# Patient Record
Sex: Female | Born: 1945 | Race: White | Hispanic: No | Smoking: Never smoker
Health system: Southern US, Community
[De-identification: ages and names within clinical notes are randomized; demographics above are authoritative.]

## PROBLEM LIST (undated history)

## (undated) ENCOUNTER — Emergency Department (HOSPITAL_COMMUNITY): Discharge status: Absent without leave | Payer: Self-pay

## (undated) DIAGNOSIS — E079 Disorder of thyroid, unspecified: Secondary | ICD-10-CM

## (undated) HISTORY — DX: Disorder of thyroid, unspecified: E07.9

## (undated) HISTORY — PX: TONSILLECTOMY: SUR1361

---

## 1999-10-27 ENCOUNTER — Other Ambulatory Visit: Admission: RE | Admit: 1999-10-27 | Discharge: 1999-10-27 | Payer: Self-pay | Admitting: Obstetrics and Gynecology

## 2000-11-15 ENCOUNTER — Other Ambulatory Visit: Admission: RE | Admit: 2000-11-15 | Discharge: 2000-11-15 | Payer: Self-pay | Admitting: Obstetrics and Gynecology

## 2001-02-03 ENCOUNTER — Other Ambulatory Visit: Admission: RE | Admit: 2001-02-03 | Discharge: 2001-02-03 | Payer: Self-pay | Admitting: Obstetrics and Gynecology

## 2001-02-23 ENCOUNTER — Encounter (INDEPENDENT_AMBULATORY_CARE_PROVIDER_SITE_OTHER): Payer: Self-pay | Admitting: Specialist

## 2001-02-23 ENCOUNTER — Other Ambulatory Visit: Admission: RE | Admit: 2001-02-23 | Discharge: 2001-02-23 | Payer: Self-pay | Admitting: Obstetrics and Gynecology

## 2001-10-19 ENCOUNTER — Other Ambulatory Visit: Admission: RE | Admit: 2001-10-19 | Discharge: 2001-10-19 | Payer: Self-pay | Admitting: *Deleted

## 2002-03-10 ENCOUNTER — Other Ambulatory Visit: Admission: RE | Admit: 2002-03-10 | Discharge: 2002-03-10 | Payer: Self-pay | Admitting: *Deleted

## 2002-06-30 ENCOUNTER — Other Ambulatory Visit: Admission: RE | Admit: 2002-06-30 | Discharge: 2002-06-30 | Payer: Self-pay | Admitting: Obstetrics and Gynecology

## 2002-10-04 ENCOUNTER — Other Ambulatory Visit: Admission: RE | Admit: 2002-10-04 | Discharge: 2002-10-04 | Payer: Self-pay | Admitting: Obstetrics and Gynecology

## 2002-10-09 ENCOUNTER — Encounter: Payer: Self-pay | Admitting: Obstetrics and Gynecology

## 2002-10-09 ENCOUNTER — Ambulatory Visit (HOSPITAL_COMMUNITY): Admission: RE | Admit: 2002-10-09 | Discharge: 2002-10-09 | Payer: Self-pay | Admitting: Obstetrics and Gynecology

## 2003-01-17 ENCOUNTER — Other Ambulatory Visit: Admission: RE | Admit: 2003-01-17 | Discharge: 2003-01-17 | Payer: Self-pay | Admitting: Obstetrics and Gynecology

## 2004-01-24 ENCOUNTER — Other Ambulatory Visit: Admission: RE | Admit: 2004-01-24 | Discharge: 2004-01-24 | Payer: Self-pay | Admitting: Obstetrics and Gynecology

## 2007-05-16 ENCOUNTER — Ambulatory Visit: Payer: Self-pay | Admitting: Cardiology

## 2007-06-02 ENCOUNTER — Encounter: Payer: Self-pay | Admitting: Cardiology

## 2007-06-02 ENCOUNTER — Ambulatory Visit: Payer: Self-pay

## 2007-06-24 ENCOUNTER — Ambulatory Visit: Payer: Self-pay | Admitting: Cardiology

## 2007-06-24 LAB — CONVERTED CEMR LAB: CRP, High Sensitivity: <1 — ABNORMAL LOW (ref 0.00–5.00)

## 2008-05-29 ENCOUNTER — Ambulatory Visit: Payer: Self-pay

## 2008-05-29 ENCOUNTER — Ambulatory Visit: Payer: Self-pay | Admitting: Cardiology

## 2008-12-19 DIAGNOSIS — I359 Nonrheumatic aortic valve disorder, unspecified: Secondary | ICD-10-CM | POA: Insufficient documentation

## 2008-12-19 DIAGNOSIS — I6529 Occlusion and stenosis of unspecified carotid artery: Secondary | ICD-10-CM | POA: Insufficient documentation

## 2008-12-19 DIAGNOSIS — R609 Edema, unspecified: Secondary | ICD-10-CM | POA: Insufficient documentation

## 2008-12-19 DIAGNOSIS — E785 Hyperlipidemia, unspecified: Secondary | ICD-10-CM

## 2010-12-24 ENCOUNTER — Ambulatory Visit
Admission: RE | Admit: 2010-12-24 | Discharge: 2010-12-24 | Payer: Self-pay | Source: Home / Self Care | Attending: Vascular Surgery | Admitting: Vascular Surgery

## 2010-12-25 NOTE — Consult Note (Signed)
NEW PATIENT CONSULTATION  Janice Hodge, TARQUINIO DOB:  [birth year]                                       12/24/2010 [id number]#:[id number]  This patient presents today for discussion regarding venous pathology in both lower extremities.  She is an otherwise healthy 65-year white female who presents due to varicosities in her medial right calf and pronounced telangiectasia over her pretibial area on the left.  She reports that she had struck this  area of the left pretibial area many years ago while scuba diving and developed telangiectasia following this.  She does report some discomfort over this with prolonged standing.  She also has  varicosities over the medial aspect of her right calf with some discomfort with prolonged standing of this as well. She does not have any history of DVT and  no significant swelling.  PAST MEDICAL HISTORY:  Significant for elevated cholesterol.  No prior surgical history.  SOCIAL HISTORY:  She is married.  She does not smoke or drink alcohol.  FAMILY HISTORY:  Positive for cardiomegaly in her father,  myocardial infarction in sister age 67.  Two brothers had heart attacks  by 60 years.  REVIEW OF SYSTEMS:  No weight loss or gain.  She weighs 140 pounds.  She is 5 foot 8 inches tall. VASCULAR:  Positive for venous pathology. PULMONARY:  Productive cough. Otherwise  review of systems is negative.  PHYSICAL EXAMINATION:  General:  Well developed, well nourished white female appearing stated age 70 in no acute stress.  Blood pressure 131/80, pulse 73, respirations 18.  HEENT:  Normal.  Her radial and dorsalis pedis pulses are 2+ bilaterally.  Musculoskeletal:  Shows no major deformities or cyanosis.  Neurologic:  No focal weakness or paresthesias.  Skin:  Without ulcers or rashes.  She does have a very pronounced large telangiectasia over her left pretibial area which is spanning out from a central reticular vein.  She has a similar  pattern to a much markedly less  degree on the right pretibial area.  She also has scattered telangiectasia over both thighs.  On the medial aspect of her right calf, she does have varicosities extending just below the knee and extending down to the mid calf.  She has not had any similar varicosities on the left leg.  She underwent a screening ultrasound by myself with SonoSite showing reflux in a dilated right great saphenous vein extending into these varicosities.  She does not have any similar enlargement in her left great saphenous vein.  I discussed this at length with this patient explaining I do not see any evidence of serious venous pathology.  I explained treatment options for sclerotherapy for the telangiectasia and laser ablation of her right great saphenous vein and stab phlebectomy of tributary varicosities in her right leg.  I explained that indication for this would severe pain.  She reports she is able to tolerate this current level of discomfort and requests continued observation only.  I feel this is appropriate.  She also understands potential benefit from sclerotherapy for cosmetic concerns in her left leg.  She will notify us should she wish to proceed with procedure.    Larina Earthly, M.D. Electronically Signed  TFE/MEDQ  D:  12/24/2010  T:  12/25/2010  Job:  [id number]

## 2011-03-31 ENCOUNTER — Encounter: Payer: Self-pay | Admitting: Family Medicine

## 2011-03-31 ENCOUNTER — Ambulatory Visit (INDEPENDENT_AMBULATORY_CARE_PROVIDER_SITE_OTHER): Payer: Federal, State, Local not specified - PPO | Admitting: Family Medicine

## 2011-03-31 VITALS — BP 93/66 | Ht 66.0 in | Wt 135.0 lb

## 2011-03-31 DIAGNOSIS — M722 Plantar fascial fibromatosis: Secondary | ICD-10-CM

## 2011-03-31 NOTE — Progress Notes (Signed)
  Subjective:    Patient ID: Janice Hodge, female    DOB: [birth year], 65 y.o.   MRN: [id number]  HPI 1-2 months ago started having right heel pain.  No new shoes, physical activity, or injury.   Has tried ice bottle massage, no improvement.  Walks daily for exercise. No history of prior injury or fracture.  The patient presents with a 1-2 long history of heel pain. This is notable for worsening pain first thing in the morning when arising and standing after sitting.   Prior foot or ankle fractures: none Prior operations: none Orthotics or bracing: none Medications: none PT or home rehab: occ stretching Night splints: no Ice massage: yes Ball massage: no  Metatarsal pain: no  REVIEW OF SYSTEMS  GEN: No fevers, chills. Nontoxic. Primarily MSK c/o today. MSK: Detailed in the HPI GI: tolerating PO intake without difficulty Neuro: No numbness, parasthesias, or tingling associated. Otherwise the pertinent positives of the ROS are noted above.    Review of Systems   Objective:   Physical Exam  GEN: Well-developed,well-nourished,in no acute distress; alert,appropriate and cooperative throughout examination HEENT: Normocephalic and atraumatic without obvious abnormalities. Ears, externally no deformities PULM: Breathing comfortably in no respiratory distress EXT: No clubbing, cyanosis, or edema PSYCH: Normally interactive. Cooperative during the interview. Pleasant. Friendly and conversant. Not anxious or depressed appearing. Normal, full affect.  RIGHT Echymosis: no Edema: no ROM: full LE B Gait: heel toe, non-antalgic MT pain: no Callus pattern: none Lateral Mall: NT Medial Mall: NT Talus: NT Navicular: NT Calcaneous: NT Metatarsals: NT 5th MT: NT Phalanges: NT Achilles: NT Plantar Fascia: tender, medial along PF. Pain with forced dorsi Fat Pad: NT Peroneals: NT Post Tib: NT Great Toe: Nml motion Ant Drawer: neg Other foot breakdown: none Long arch:  preserved Transverse arch: preserved Hindfoot breakdown: none Sensation: intact          Assessment & Plan:

## 2011-03-31 NOTE — Patient Instructions (Signed)
Please read handouts on Plantar Fascitis.  STRETCHING and Strengthening program critically important.  Strengthening on foot and calf muscles as seen in handout. Calf raises, 2 legged, then 1 legged. Foot massage with tennis ball. Ice massage.  NEEDS TO BE DONE EVERY DAY  Recommended over the counter insoles. (Spenco or Hapad)  A rigid shoe with good arch support helps: Dansko (great), Keen, Merrell No easily bendable shoes.   

## 2011-03-31 NOTE — Assessment & Plan Note (Addendum)
Recommended shoes with a heel, to use heel cup, rehabilitation exercise- see patient instructions  We reviewed that stretching is critically important to the treatment of PF. Reviewed footwear. Rigid soles have been shown to help with PF. Reviewed rehab of stretching and calf raises.  The patient would be an ideal candidate for custom orthotics, which were shown in a 2008 Cochrane Review to be beneficial in PF. Could benefit from a corticosteroid injection if conservative treatment fails.

## 2011-04-14 NOTE — Assessment & Plan Note (Signed)
[facility]                            CARDIOLOGY OFFICE NOTE   Janice, Hodge                       MRN:          [id number]  DATE:05/18/2007                            DOB:          1946-10-08    REFERRING PHYSICIAN:  Carrington Clamp, M.D.   REASON FOR EVALUATION:  Cardiac assessment with family history of  coronary artery disease, lower extremity edema and hyperlipidemia.   HISTORY OF PRESENT ILLNESS:  Janice Hodge is a 65 year old woman with an  apparent history of hypothyroidism and treated with Armour Thyroid over  the last several months.  She had a TSH of 27 back in April, although  with low normal T4 and T3.  At that time, she also had an LDL  cholesterol of 156 and an ACL cholesterol of 89.  She has no personal  history of hypertension, type 2 diabetes mellitus or cardiovascular  disease.  She does report a history of mitral valve prolapse .  Symptomatically, she denies having any problems with chest pain or  dyspnea on exertion.  She reports some edema in her ankles and in  retrospect a long-standing history of a asymmetry of her legs around the  calves, right greater than left, since age 65.  She also tells me that  she had a general health screening approximately 6-8 years ago and was  told that she had some degree of carotid artery plaque.  She headache  shad no follow up for this since that time.  She has been more concerned  generally with her health and these issues and is referred here today to  discuss additional cardiac risk stratification testing.  Her resting  electrocardiogram shows borderline left ventricular hypertrophy with  possible left atrial enlargement.  Sinus rhythm is noted at 62 beats per  minute.  Today, I reviewed her risk factor profile and also discussed  her testing.  Her family history is significant for cardiovascular  disease, predominantly in her siblings, beginning in the fifth to sixth  decade.   ALLERGIES:  No known drug allergies.   PREMEDICATION:  1. Armour thyroid 30 mg two tablets p.o. daily.  2. Contegra daily.  3. DHEA 5 mg p.o. daily.  4. Algie Coffer one p.o. p.r.n.  5. NMP 50 mg p.o. daily.  6. Omega-3 supplements.  7. Osteo sheath b.i.d.  8. Other multivitamin preparations.   PAST MEDICAL HISTORY:  As outlined above.  The patient reports removal  of an ovarian cyst in 1994.  Denies any other major surgeries.   REVIEW OF SYSTEMS:  As described in history of present illness.  She  complains of general fatigue.  She also does not sleep well.  She has no  palpitations, orthopnea, PND or syncope.   SOCIAL HISTORY:  The patient has two children.  She works as a Corporate treasurer.  She follows her diet carefully.  She denies any tobacco or  alcohol use.  She exercises primarily by walking.   FAMILY HISTORY:  AS outlined above.  The patient reports that her father  died at age 71.  He had  an enlarged heart.  She has a sister who died  at age 30 with uterine cancer and also heart disease.  Other siblings  with heart disease in their 56s and 9s.   PHYSICAL EXAMINATION:  VITAL SIGNS:  Blood pressure 103/66, heart rate  62, weight 145 pounds.  GENERAL:  This is a normally nourished appearing woman in no acute  distress.  HEENT:  Conjunctivae is normal.  Pharynx is clear.  NECK:  Supple.  No elevated jugular venous pressure, and without bruits.  No thyromegaly or thyroid tenderness is noted.  LUNGS:  Clear without air breathing.  CARDIAC:  Regular rate and rhythm with an early systolic click.  No loud  murmur.  No S3 gallops.  ABDOMEN:  Soft, normoactive bowel sounds . No bruits.  EXTREMITIES:  No significant pitting edema.  There is some mild  asymmetry to the calves, right greater than left.  Although, not  markedly so.  She does have evidence of some venous varicosities and  spider veins.  Distal pulses are 2+.  MUSCULOSKELETAL:  No kyphosis is noted.   NEUROPSYCHIATRIC:  Patient is alert and oriented x3.  Affect is normal.   IMPRESSION/RECOMMENDATIONS:  General cardiac screening in a 65 year old  woman with hyperlipidemia as outlined.  Family history of premature  cardiovascular disease, possible hypothyroidism and apparent mitral  valve prolapse based on early systolic click and reported history.  She  also reports a prior health screening with a report of carotid  atherosclerosis.  She has not had any frank bruits on exam.  She relates  some lower extremity edema around the ankles and general asymmetry to  her calves, although, this seems to be a chronic problem.   PLAN:  1. Our plan is a screening exercise echocardiogram with rest images to      assess the mitral valve as well.  We will also follow up with a      carotid Duplex scan and lower extremity venous studies.  I will      then have her follow up over the next few weeks to discuss the      results.  2. Further plans to follow.     Jonelle Sidle, MD  Electronically Signed    SGM/MedQ  DD: 05/16/2007  DT: 05/17/2007  Job #: [id number]   cc:   Carrington Clamp, M.D.

## 2011-04-14 NOTE — Assessment & Plan Note (Signed)
[facility]                            CARDIOLOGY OFFICE NOTE   Janice Hodge, Janice Hodge                       MRN:          [id number]  DATE:05/29/2008                            DOB:          1946/06/01    PRIMARY CARE PHYSICIAN:  Carrington Clamp, MD.   REASON FOR VISIT:  Cardiac followup.   HISTORY OF PRESENT ILLNESS:  I saw Janice Hodge back in June 2008.  She is  referred for general cardiac assessment as outlined in my previous note.  We referred her for screening studies including an exercise  echocardiogram, which was normal and a resting echocardiogram  demonstrating mild aortic valvular regurgitation and left ventricular  ejection fraction of 55-65%.  She had lower extremity venous studies  given concerns about asymmetric edema in her right leg and this revealed  no evidence of deep or superficial venous thromboses.  She had carotid  studies demonstrating mild bilateral internal carotid artery  atherosclerotic plaque.  She had a followup study earlier today  revealing similar findings.  Symptomatically, Janice Hodge denies any  problems with angina or breathlessness.  Today, we talked about diet and  exercise strategies.  At this point, I do not anticipate any additional  cardiac evaluation assuming she continues with regular followup and  primary care.   ALLERGIES:  No known drug allergies.   MEDICATIONS:  Include Armour Thyroid and herbal supplements.   REVIEW OF SYSTEMS:  As described in the history of present illness.  She  continues to have a sensation and her right calf is larger than left and  this has been a chronic problem for multiple years.  There has been no  change.  No palpitations or syncope.  Otherwise, negative.   PHYSICAL EXAMINATION:  VITAL SIGNS:  Blood pressure is 98/68, heart rate  is 88, and weight is 148 pounds.  GENERAL:  The patient is normally nourished appearing, in no acute  distress.  NECK:  No elevated jugular  venous pressure.  LUNGS:  Clear without labored breathing.  CARDIAC:  Regular rate and rhythm.  No pathologic murmur noted today.  EXTREMITIES:  No frank pitting edema.   IMPRESSION AND RECOMMENDATIONS:  Documentation of mild internal carotid  artery plaquing, asymptomatic.  My suggestion at this point is basic  risk factor modification mainly blood pressure and lipid management.  She has had ischemic testing, which was very reassuring and has no major  valvular heart disease by echocardiography.  She also had no major  findings of venous thrombotic disease on prior ultrasound examination of  the lower  extremities.  I anticipate that Janice Hodge will continue to follow with  Dr. Henderson Cloud and her cardiology followup can be as needed.     Jonelle Sidle, MD  Electronically Signed    SGM/MedQ  DD: 05/29/2008  DT: 05/30/2008  Job #: [id number]   cc:   Carrington Clamp, M.D.

## 2011-04-17 NOTE — Assessment & Plan Note (Signed)
[facility]                            CARDIOLOGY OFFICE NOTE   Janice Hodge, Janice Hodge                       MRN:          [id number]  DATE:06/24/2007                            DOB:          08-05-1946    PRIMARY CARE PHYSICIAN:  Talmadge Coventry, M.D.   REASON FOR VISIT:  Followup cardiac testing.   HISTORY OF PRESENT ILLNESS:  I saw Janice Hodge back in June for general  CV evaluation with a family history of premature cardiovascular disease,  hypothyroidism, possible mitral valve prolapse, and also hyperlipidemia.  She had complained of some general asymmetry of her lower extremities  and some possible venous insufficiency, as well as varicosities.  She  had also undergone a previous health screening demonstrating some  element of carotid artery disease.  We performed a followup carotid  duplex scan, which revealed a 0-39% bilateral internal carotid artery  stenoses.  Lower extremity venous studies demonstrated no deep or  superficial venous thrombus.  There was mild reflux in the left  posterior tibial and popliteal veins with 1 to 2 seconds of flow  reversal, and I suspect that she does have some venous insufficiency.  Today, we talked about compression hose.  Her resting echocardiogram  demonstrated a normal left ventricular ejection fraction of 55-60%.  The  mitral valve was described as normal, and there was only trivial mitral  regurgitation.  Mild aortic regurgitation was also noted.  Stress  echocardiography demonstrated no active ischemia, either by  electrocardiographic, or echocardiographic findings.  Today, I reviewed  these studies with the patient and her husband.  We also went over her  prior lipid profile, which showed a total cholesterol of 260,  triglyceride 75, HDL 89, and LDL 156.  Talked about some risk factor  modification strategies today.   ALLERGIES:  No known drug allergies.   MEDICATIONS:  AmourThyroid 30 mg 2 tablets  p.o. daily.   REVIEW OF SYSTEMS:  As described in the history of present illness.   EXAMINATION:  Blood pressure is 102/69, heart rate is 66.  No other marked changes in examination as documented in the previous  note.   IMPRESSION/RECOMMENDATIONS:  1. Dyslipidemia as outlined above.  Janice Hodge has some family history      of premature cardiovascular disease and evidence of mild      atherosclerosis based on her carotid artery testing.  She is      asymptomatic at this time.  We discussed a baby aspirin daily,      particularly when she nears 65 years of age for primary prevention.      I also think it would be reasonable to send a high sensitivity C-      reactive protein to help [place] determine how aggressive we should be      with her LDL cholesterol.  She has a relatively effective effect      with her high HDL at this time.  Otherwise, my plan is to have her      come back in 1 year's time for a repeat carotid ultrasound.  We did      talk about compression hose given her venous varicosities and some      element of venous insufficiency.  Her other cardiac testing was      reassuring.  We will plan to inform her of her high sensitivity C-      reactive protein results and to proceed on from      there.  2. Further plan is to follow.     Jonelle Sidle, MD  Electronically Signed    SGM/MedQ  DD: 06/24/2007  DT: 06/24/2007  Job #: [id number]   cc:   Talmadge Coventry, M.D.  Carrington Clamp, M.D.

## 2011-07-16 ENCOUNTER — Ambulatory Visit
Admission: RE | Admit: 2011-07-16 | Discharge: 2011-07-16 | Disposition: A | Discharge status: Home / Self Care | Payer: Federal, State, Local not specified - PPO | Source: Ambulatory Visit | Attending: Internal Medicine | Admitting: Internal Medicine

## 2011-07-16 ENCOUNTER — Other Ambulatory Visit: Payer: Self-pay | Admitting: Internal Medicine

## 2011-07-16 DIAGNOSIS — J189 Pneumonia, unspecified organism: Secondary | ICD-10-CM

## 2011-07-16 MED ORDER — IOHEXOL 300 MG/ML  SOLN
75.0000 mL | Freq: Once | INTRAMUSCULAR | Status: AC | PRN
  Administered 2011-07-16: 75 mL via INTRAVENOUS

## 2012-03-16 ENCOUNTER — Encounter: Payer: Self-pay | Admitting: Internal Medicine

## 2012-04-01 ENCOUNTER — Ambulatory Visit (AMBULATORY_SURGERY_CENTER): Payer: Federal, State, Local not specified - PPO | Admitting: *Deleted

## 2012-04-01 VITALS — Ht 67.0 in | Wt 155.0 lb

## 2012-04-01 DIAGNOSIS — Z1211 Encounter for screening for malignant neoplasm of colon: Secondary | ICD-10-CM

## 2012-04-01 MED ORDER — PEG-KCL-NACL-NASULF-NA ASC-C 100 G PO SOLR: ORAL | Status: DC

## 2012-04-04 ENCOUNTER — Encounter: Payer: Self-pay | Admitting: Internal Medicine

## 2012-05-06 ENCOUNTER — Ambulatory Visit (AMBULATORY_SURGERY_CENTER): Payer: Federal, State, Local not specified - PPO | Admitting: Internal Medicine

## 2012-05-06 ENCOUNTER — Encounter: Payer: Self-pay | Admitting: Internal Medicine

## 2012-05-06 VITALS — BP 101/68 | HR 51 | Temp 97.7°F | Resp 16 | Ht 67.0 in | Wt 155.0 lb

## 2012-05-06 DIAGNOSIS — Z1211 Encounter for screening for malignant neoplasm of colon: Secondary | ICD-10-CM

## 2012-05-06 MED ORDER — SODIUM CHLORIDE 0.9 % IV SOLN: 500.0000 mL | INTRAVENOUS | Status: DC

## 2012-05-06 NOTE — Op Note (Signed)
[facility] [address] [place], [place]  [address]  COLONOSCOPY PROCEDURE REPORT  PATIENT:  Janice Hodge, Janice Hodge  MR#:  [id number] BIRTHDATE:  1946-01-31, 66 yrs. old  GENDER:  female ENDOSCOPIST:  Hedwig Morton. Juanda Chance, MD REF. BY:  Rodrigo Ran, M.D. PROCEDURE DATE:  05/06/2012 PROCEDURE:  Colonoscopy 65784 ASA CLASS:  Class I INDICATIONS:  colorectal cancer screening, average risk MEDICATIONS:   MAC sedation, administered by CRNA, propofol (Diprivan) 120 mg  DESCRIPTION OF PROCEDURE:   After the risks and benefits and of the procedure were explained, informed consent was obtained. Digital rectal exam was performed and revealed no rectal masses. The LB CF-[id number] endoscope was introduced through the anus and advanced to the cecum, which was identified by both the appendix and ileocecal valve.  The quality of the prep was excellent, using MoviPrep.  The instrument was then slowly withdrawn as the colon was fully examined. <<PROCEDUREIMAGES>>  FINDINGS:  Scattered diverticula were found in the sigmoid colon (see image4). several shallow diverticuli, sharp turn at the pelvic rim  This was otherwise a normal examination of the colon (see image5, image3, image2, and image1).   Retroflexed views in the rectum revealed no abnormalities.    The scope was then withdrawn from the patient and the procedure completed.  COMPLICATIONS:  None ENDOSCOPIC IMPRESSION: 1) Diverticula, scattered in the sigmoid colon 2) Otherwise normal examination RECOMMENDATIONS: 1) High fiber diet.  REPEAT EXAM:  In 10 year(s) for.  ______________________________ Hedwig Morton. Juanda Chance, MD  CC:  n. eSIGNED:   Hedwig Morton. Meleana Commerford at 05/06/2012 08:32 AM  Fran Lowes, [phone]

## 2012-05-06 NOTE — Patient Instructions (Signed)
YOU HAD AN ENDOSCOPIC PROCEDURE TODAY AT THE [facility]: Refer to the procedure report that was given to you for any specific questions about what was found during the examination.  If the procedure report does not answer your questions, please call your gastroenterologist to clarify.  If you requested that your care partner not be given the details of your procedure findings, then the procedure report has been included in a sealed envelope for you to review at your convenience later.  YOU SHOULD EXPECT: Some feelings of bloating in the abdomen. Passage of more gas than usual.  Walking can help get rid of the air that was put into your GI tract during the procedure and reduce the bloating. If you had a lower endoscopy (such as a colonoscopy or flexible sigmoidoscopy) you may notice spotting of blood in your stool or on the toilet paper. If you underwent a bowel prep for your procedure, then you may not have a normal bowel movement for a few days.  DIET: Your first meal following the procedure should be a light meal and then it is ok to progress to your normal diet.  A half-sandwich or bowl of soup is an example of a good first meal.  Heavy or fried foods are harder to digest and may make you feel nauseous or bloated.  Likewise meals heavy in dairy and vegetables can cause extra gas to form and this can also increase the bloating.  Drink plenty of fluids but you should avoid alcoholic beverages for 24 hours.  ACTIVITY: Your care partner should take you home directly after the procedure.  You should plan to take it easy, moving slowly for the rest of the day.  You can resume normal activity the day after the procedure however you should NOT DRIVE or use heavy machinery for 24 hours (because of the sedation medicines used during the test).    SYMPTOMS TO REPORT IMMEDIATELY: A gastroenterologist can be reached at any hour.  During normal business hours, 8:30 AM to 5:00 PM Monday through Friday,  call [phone].  After hours and on weekends, please call the GI answering service at [phone] who will take a message and have the physician on call contact you.   Following lower endoscopy (colonoscopy or flexible sigmoidoscopy):  Excessive amounts of blood in the stool  Significant tenderness or worsening of abdominal pains  Swelling of the abdomen that is new, acute  Fever of 100F or higher  Black, tarry-looking stools  FOLLOW UP: If any biopsies were taken you will be contacted by phone or by letter within the next 1-3 weeks.  Call your gastroenterologist if you have not heard about the biopsies in 3 weeks.  Our staff will call the home number listed on your records the next business day following your procedure to check on you and address any questions or concerns that you may have at that time regarding the information given to you following your procedure. This is a courtesy call and so if there is no answer at the home number and we have not heard from you through the emergency physician on call, we will assume that you have returned to your regular daily activities without incident.  SIGNATURES/CONFIDENTIALITY: You and/or your care partner have signed paperwork which will be entered into your electronic medical record.  These signatures attest to the fact that that the information above on your After Visit Summary has been reviewed and is understood.  Full responsibility of   the confidentiality of this discharge information lies with you and/or your care-partner.  

## 2012-05-06 NOTE — Progress Notes (Signed)
Patient did not experience any of the following events: a burn prior to discharge; a fall within the facility; wrong site/side/patient/procedure/implant event; or a hospital transfer or hospital admission upon discharge from the facility. (G8907) Patient did not have preoperative order for IV antibiotic SSI prophylaxis. (G8918)  

## 2012-05-09 ENCOUNTER — Telehealth: Payer: Self-pay | Admitting: *Deleted

## 2012-05-09 NOTE — Telephone Encounter (Signed)
  Follow up Call-  Call back number 05/06/2012  Post procedure Call Back phone  # [phone]  Permission to leave phone message Yes     Tried to call x2 and both times line was busy.

## 2013-04-27 ENCOUNTER — Other Ambulatory Visit: Payer: Self-pay | Admitting: Obstetrics and Gynecology

## 2013-04-27 DIAGNOSIS — Z1231 Encounter for screening mammogram for malignant neoplasm of breast: Secondary | ICD-10-CM

## 2013-05-10 ENCOUNTER — Other Ambulatory Visit: Payer: Self-pay | Admitting: Obstetrics and Gynecology

## 2013-05-10 ENCOUNTER — Ambulatory Visit
Admission: RE | Admit: 2013-05-10 | Discharge: 2013-05-10 | Disposition: A | Discharge status: Home / Self Care | Payer: Federal, State, Local not specified - PPO | Source: Ambulatory Visit | Attending: Obstetrics and Gynecology | Admitting: Obstetrics and Gynecology

## 2013-05-10 DIAGNOSIS — Z1231 Encounter for screening mammogram for malignant neoplasm of breast: Secondary | ICD-10-CM

## 2013-05-24 ENCOUNTER — Ambulatory Visit: Payer: Federal, State, Local not specified - PPO

## 2013-06-13 ENCOUNTER — Ambulatory Visit (INDEPENDENT_AMBULATORY_CARE_PROVIDER_SITE_OTHER): Payer: Federal, State, Local not specified - PPO | Admitting: Endocrinology

## 2013-06-13 ENCOUNTER — Encounter: Payer: Self-pay | Admitting: Endocrinology

## 2013-06-13 VITALS — BP 116/72 | HR 62 | Temp 97.6°F | Resp 14 | Ht 66.0 in | Wt 152.0 lb

## 2013-06-13 DIAGNOSIS — E039 Hypothyroidism, unspecified: Secondary | ICD-10-CM

## 2013-06-13 NOTE — Progress Notes (Signed)
Subjective:    Patient ID: Janice Hodge, female    DOB: 1946-11-05, 67 y.o.   MRN: [id number]  HPI Pt reports approx 50 years of moderate chronic primary hypothyroidism.  She was not rx'ed until the mid-1990's, when she was rx'ed pork thyroid extract.  Old recocrds obtained say that, although results are not available, she had normal TFT off any thyroid medication.  She says she has been on synthroid at her current dosage of 75 mcg/day, for at least 15 years.  However, chart says she was noted by dr Waynard Edwards to have elevated TSH in 2012.  She reports mild hair loss throughout the head, and assoc fatigue.  She has read that her sxs might be attributable to iodine deficiency.  She previously took an OTC iodine supplement, but has been off it x approx 6 weeks.   Past Medical History  Diagnosis Date  . Thyroid disease     Past Surgical History  Procedure Laterality Date  . Tonsillectomy      as child    History   Social History  . Marital Status: Divorced    Spouse Name: N/A    Number of Children: N/A  . Years of Education: N/A   Occupational History  . Not on file.   Social History Main Topics  . Smoking status: Never Smoker   . Smokeless tobacco: Never Used  . Alcohol Use: Yes     Comment: 'seasonal drinking" per pt.  . Drug Use: No  . Sexually Active: Not on file   Other Topics Concern  . Not on file   Social History Narrative  . No narrative on file    Current Outpatient Prescriptions on File Prior to Visit  Medication Sig Dispense Refill  . SYNTHROID 75 MCG tablet Take 1 tablet by mouth Daily. BRAND NAME ONLY      . valACYclovir (VALTREX) 500 MG tablet Take 1 tablet by mouth as needed.       No current facility-administered medications on file prior to visit.    No Known Allergies  Family History  Problem Relation Age of Onset  . Colon cancer Neg Hx   . Heart disease Mother   . Heart disease Father     BP 116/72  Pulse 62  Temp(Src) 97.6 F (36.4 C)  (Oral)  Resp 14  Ht 5\' 6"  (1.676 m)  Wt 152 lb (68.947 kg)  BMI 24.55 kg/m2  SpO2 97%    Review of Systems denies depression, cramps, sob, weight gain, constipation, numbness, blurry vision, myalgias, dry skin, rhinorrhea, easy bruising, and syncope.  She reports intermittent forgetfulness, decreased libido,  and anxiety.      Objective:   Physical Exam VS: see vs page GEN: no distress HEAD: head: no deformity eyes: no periorbital swelling, no proptosis external nose and ears are normal mouth: no lesion seen NECK: thyroid is slightly enlarged, with an irregular surface.  No palpable nodule.   CHEST WALL: no deformity LUNGS:  Clear to auscultation CV: reg rate and rhythm, no murmur ABD: abdomen is soft, nontender.  no hepatosplenomegaly.  not distended.  no hernia.   MUSCULOSKELETAL: muscle bulk and strength are grossly normal.  no obvious joint swelling.  gait is normal and steady EXTEMITIES: no deformity.  no ulcer on the feet.  feet are of normal color and temp.  no edema.   PULSES: dorsalis pedis intact bilat.  no carotid bruit NEURO:  cn 2-12 grossly intact.   readily moves all 4's.  sensation is intact to touch on the feet.   SKIN:  Normal texture and temperature.  No rash or suspicious lesion is visible.   NODES:  None palpable at the neck PSYCH: alert, oriented x3.  Does not appear anxious nor depressed.     Assessment & Plan:  Chronic hypothyroidism, on rx Anxiety, uncertain if thyroid-related.  This would depend on her recent TFT, so i'll obtain results. Fatigue, prob not thyroid-related.

## 2013-06-13 NOTE — Patient Instructions (Addendum)
blood tests are being requested for you today.  We'll contact you with results.  

## 2013-06-15 LAB — IODINE, SERUM/PLASMA: Iodine: 67 mcg/L (ref 52–109)

## 2014-04-11 ENCOUNTER — Emergency Department (INDEPENDENT_AMBULATORY_CARE_PROVIDER_SITE_OTHER)
Admission: EM | Admit: 2014-04-11 | Discharge: 2014-04-11 | Disposition: A | Discharge status: Home / Self Care | Payer: Federal, State, Local not specified - PPO | Source: Home / Self Care | Attending: Emergency Medicine | Admitting: Emergency Medicine

## 2014-04-11 ENCOUNTER — Encounter (HOSPITAL_COMMUNITY): Payer: Self-pay | Admitting: Emergency Medicine

## 2014-04-11 DIAGNOSIS — J309 Allergic rhinitis, unspecified: Secondary | ICD-10-CM

## 2014-04-11 NOTE — ED Provider Notes (Signed)
CSN: [id number]     Arrival date & time 04/11/14  1543 History   First MD Initiated Contact with Patient 04/11/14 1744     Chief Complaint  Patient presents with  . Sore Throat   (Consider location/radiation/quality/duration/timing/severity/associated sxs/prior Treatment) Patient is a 68 y.o. female presenting with pharyngitis. The history is provided by the patient.  Sore Throat This is a new problem. Episode onset: 6 weeks ago. The problem occurs constantly. The problem has not changed since onset.Pertinent negatives include no shortness of breath. Exacerbated by: worst at night and in the morning. Relieved by: prn use of steroid nasal spray has helped some. Treatments tried: steroid nasal spray. The treatment provided mild relief.    Past Medical History  Diagnosis Date  . Thyroid disease    Past Surgical History  Procedure Laterality Date  . Tonsillectomy      as child   Family History  Problem Relation Age of Onset  . Colon cancer Neg Hx   . Heart disease Mother   . Heart disease Father    History  Substance Use Topics  . Smoking status: Never Smoker   . Smokeless tobacco: Never Used  . Alcohol Use: Yes     Comment: 'seasonal drinking" per pt.   OB History   Grav Para Term Preterm Abortions TAB SAB Ect Mult Living                 Review of Systems  Constitutional: Negative for fever and chills.  HENT: Positive for postnasal drip, rhinorrhea and sore throat. Negative for congestion, ear pain and sinus pressure.   Respiratory: Negative for cough and shortness of breath.     Allergies  Review of patient's allergies indicates no known allergies.  Home Medications   Prior to Admission medications   Medication Sig Start Date End Date Taking? Authorizing Provider  SYNTHROID 75 MCG tablet Take 1 tablet by mouth Daily. BRAND NAME ONLY 03/18/12  Yes Historical Provider, MD  b complex vitamins tablet Take 1 tablet by mouth daily.    Historical Provider, MD   CALCIUM-VITAMIN D PO Take by mouth daily.    Historical Provider, MD  Cholecalciferol (VITAMIN D-3 PO) Take by mouth daily.    Historical Provider, MD  Multiple Vitamin (MULTIVITAMIN) tablet Take 1 tablet by mouth daily.    Historical Provider, MD  valACYclovir (VALTREX) 500 MG tablet Take 1 tablet by mouth as needed. 03/31/12   Historical Provider, MD   BP 119/73  Pulse 57  Temp(Src) 98.6 F (37 C) (Oral)  Resp 17  SpO2 97% Physical Exam  Constitutional: She appears well-developed and well-nourished. No distress.  HENT:  Right Ear: Tympanic membrane, external ear and ear canal normal.  Left Ear: Tympanic membrane, external ear and ear canal normal.  Nose: Rhinorrhea present.  Mouth/Throat: Uvula is midline, oropharynx is clear and moist and mucous membranes are normal.  Boggy nasal mucosa  Cardiovascular: Normal rate and regular rhythm.   Pulmonary/Chest: Effort normal and breath sounds normal.    ED Course  Procedures (including critical care time) Labs Review Labs Reviewed - No data to display  Imaging Review No results found.   MDM   1. Allergic rhinitis   sx most c/w allergic rhinitis. Pt to use otc 2nd gen antihistamine for a month, use steroid nasal spray (nasocort or nasonex) as prescribed daily for next month. F/u with pcp if this doesn't relieve sx.      Cathlyn ParsonsAngela M Valeska Haislip, NP 04/11/14 [phone]

## 2014-04-11 NOTE — ED Notes (Signed)
Pt reports sore throat x1 month while at work [employer]elieves she was exposed to chemical at work??? Sx include "tight throat," fatigue Denies f/v/n/d, HA, dyspnea Also c/o pain right axilla pain onset yest Alert w/no signs of acute distress.

## 2014-04-11 NOTE — Discharge Instructions (Signed)
Use your nasal spray EVERY day as prescribed for the next month. Use Claritin or zyrtec or Allegra (or generic brand) every day for a month.  If these treatments don't resolve your problem, follow up with your primary care doctor.    Allergic Rhinitis Allergic rhinitis is when the mucous membranes in the nose respond to allergens. Allergens are particles in the air that cause your body to have an allergic reaction. This causes you to release allergic antibodies. Through a chain of events, these eventually cause you to release histamine into the blood stream. Although meant to protect the body, it is this release of histamine that causes your discomfort, such as frequent sneezing, congestion, and an itchy, runny nose.  CAUSES  Seasonal allergic rhinitis (hay fever) is caused by pollen allergens that may come from grasses, trees, and weeds. Year-round allergic rhinitis (perennial allergic rhinitis) is caused by allergens such as house dust mites, pet dander, and mold spores.  SYMPTOMS   Nasal stuffiness (congestion).  Itchy, runny nose with sneezing and tearing of the eyes. DIAGNOSIS  Your health care provider can help you determine the allergen or allergens that trigger your symptoms. If you and your health care provider are unable to determine the allergen, skin or blood testing may be used. TREATMENT  Allergic Rhinitis does not have a cure, but it can be controlled by:  Medicines and allergy shots (immunotherapy).  Avoiding the allergen. Hay fever may often be treated with antihistamines in pill or nasal spray forms. Antihistamines block the effects of histamine. There are over-the-counter medicines that may help with nasal congestion and swelling around the eyes. Check with your health care provider before taking or giving this medicine.  If avoiding the allergen or the medicine prescribed do not work, there are many new medicines your health care provider can prescribe. Stronger medicine may  be used if initial measures are ineffective. Desensitizing injections can be used if medicine and avoidance does not work. Desensitization is when a patient is given ongoing shots until the body becomes less sensitive to the allergen. Make sure you follow up with your health care provider if problems continue. HOME CARE INSTRUCTIONS It is not possible to completely avoid allergens, but you can reduce your symptoms by taking steps to limit your exposure to them. It helps to know exactly what you are allergic to so that you can avoid your specific triggers. SEEK MEDICAL CARE IF:   You have a fever.  You develop a cough that does not stop easily (persistent).  You have shortness of breath.  You start wheezing.  Symptoms interfere with normal daily activities. Document Released: 08/11/2001 Document Revised: 09/06/2013 Document Reviewed: 07/24/2013 [facility] Patient Information 2014 [place], [place].

## 2014-04-14 NOTE — ED Provider Notes (Signed)
Medical screening examination/treatment/procedure(s) were performed by non-physician practitioner and as supervising physician I was immediately available for consultation/collaboration.  Leslee Homeavid Demari Gales, M.D.  Reuben Likesavid C Sarafina Puthoff, MD 04/14/14 [phone]

## 2014-12-07 DIAGNOSIS — M9902 Segmental and somatic dysfunction of thoracic region: Secondary | ICD-10-CM | POA: Diagnosis not present

## 2014-12-07 DIAGNOSIS — M531 Cervicobrachial syndrome: Secondary | ICD-10-CM | POA: Diagnosis not present

## 2014-12-07 DIAGNOSIS — M5416 Radiculopathy, lumbar region: Secondary | ICD-10-CM | POA: Diagnosis not present

## 2014-12-07 DIAGNOSIS — M9901 Segmental and somatic dysfunction of cervical region: Secondary | ICD-10-CM | POA: Diagnosis not present

## 2014-12-07 DIAGNOSIS — M5414 Radiculopathy, thoracic region: Secondary | ICD-10-CM | POA: Diagnosis not present

## 2014-12-07 DIAGNOSIS — M9903 Segmental and somatic dysfunction of lumbar region: Secondary | ICD-10-CM | POA: Diagnosis not present

## 2014-12-11 DIAGNOSIS — M9901 Segmental and somatic dysfunction of cervical region: Secondary | ICD-10-CM | POA: Diagnosis not present

## 2014-12-11 DIAGNOSIS — M9903 Segmental and somatic dysfunction of lumbar region: Secondary | ICD-10-CM | POA: Diagnosis not present

## 2014-12-11 DIAGNOSIS — M9902 Segmental and somatic dysfunction of thoracic region: Secondary | ICD-10-CM | POA: Diagnosis not present

## 2014-12-11 DIAGNOSIS — M5414 Radiculopathy, thoracic region: Secondary | ICD-10-CM | POA: Diagnosis not present

## 2014-12-11 DIAGNOSIS — M5416 Radiculopathy, lumbar region: Secondary | ICD-10-CM | POA: Diagnosis not present

## 2014-12-11 DIAGNOSIS — M531 Cervicobrachial syndrome: Secondary | ICD-10-CM | POA: Diagnosis not present

## 2015-01-28 DIAGNOSIS — M25511 Pain in right shoulder: Secondary | ICD-10-CM | POA: Diagnosis not present

## 2015-01-28 DIAGNOSIS — M19011 Primary osteoarthritis, right shoulder: Secondary | ICD-10-CM | POA: Diagnosis not present

## 2016-09-01 ENCOUNTER — Ambulatory Visit: Payer: Federal, State, Local not specified - PPO | Admitting: Neurology

## 2016-09-01 ENCOUNTER — Telehealth: Payer: Self-pay

## 2016-09-01 NOTE — Telephone Encounter (Signed)
Pt did not show for their appt with Dr. Dohmeier today.  

## 2016-09-03 ENCOUNTER — Encounter: Payer: Self-pay | Admitting: Neurology

## 2016-11-10 ENCOUNTER — Other Ambulatory Visit: Payer: Self-pay | Admitting: Family Medicine

## 2016-11-10 DIAGNOSIS — F0390 Unspecified dementia without behavioral disturbance: Secondary | ICD-10-CM

## 2016-11-17 ENCOUNTER — Ambulatory Visit
Admission: RE | Admit: 2016-11-17 | Discharge: 2016-11-17 | Disposition: A | Discharge status: Home / Self Care | Payer: Federal, State, Local not specified - PPO | Source: Ambulatory Visit | Attending: Family Medicine | Admitting: Family Medicine

## 2016-11-17 DIAGNOSIS — F0390 Unspecified dementia without behavioral disturbance: Secondary | ICD-10-CM

## 2016-12-24 ENCOUNTER — Ambulatory Visit (INDEPENDENT_AMBULATORY_CARE_PROVIDER_SITE_OTHER): Payer: Federal, State, Local not specified - PPO | Admitting: Neurology

## 2016-12-24 ENCOUNTER — Encounter: Payer: Self-pay | Admitting: Neurology

## 2016-12-24 VITALS — BP 119/77 | HR 70 | Resp 20 | Ht 68.0 in | Wt 167.0 lb

## 2016-12-24 DIAGNOSIS — F02B18 Dementia in other diseases classified elsewhere, moderate, with other behavioral disturbance: Secondary | ICD-10-CM

## 2016-12-24 DIAGNOSIS — G3109 Other frontotemporal dementia: Secondary | ICD-10-CM

## 2016-12-24 DIAGNOSIS — F0281 Dementia in other diseases classified elsewhere with behavioral disturbance: Secondary | ICD-10-CM | POA: Diagnosis not present

## 2016-12-24 NOTE — Progress Notes (Signed)
Provider:  Melvyn Novas, M D  Referring Provider: Rodrigo Ran, MD Primary Care Physician:  Ezequiel Kayser, MD  Chief Complaint  Patient presents with  . New Patient (Initial Visit)    memory issues    HPI:  Janice Hodge is a 71 y.o. female and  Is seen here as a referral from Dr. Nolen Mu ,  The patient is referred for a evaluation of memory loss, changes in her cognitive abilities, increased confusion and some personality changes over the last year. And her appetite is not decreased. Her husband with reports that sometimes when she wakes up out of sleep or from a nap she appears to be initially confused but this passes quickly. The patient has been changing progressively over the last 18 month, she was introduced as the second to youngest child of 6 and her family it seems that her paternal grandmother had a ([place] on her in childhood, she grew up near [place], attended a [employer] girls school, she has grown children from a previous marriage and has been remarried for 14 or 15 years to her second husband. Her oldest brother Max had dementia, another is in a long term care facility in [facility] with dementia .Her father had dementia and was an alcoholic. Mother drank, too.  The patient states that her upbringing in [place] was affected by her parents addictions.   The patient onset that she still driving and that she still knows where she is going. She does confess that she tends to forget the passwords to computers, she states that she can use a TM machine at an ulcer [facility]. She is under the impression that she still prepares meals and keeps the house. In reality her husband has taken more and more of these chores on, the couple has separate checking accounts and she has still full control over her account. She has gotten lost driving. She forgot that. In her mind, she found home by herself. Her husband reports that other  Shops/ locations have called him to pick her up after she appeared  lost. She has good and bad days, no parkinsonism and no incontinence- but she states frequently she will take " herself "out if she has dementia.     Review of Systems: Out of a complete 14 system review, the patient complains of only the following symptoms, and all other reviewed systems are negative.  MMSE - Mini Mental State Exam 12/24/2016  Orientation to time 0  Orientation to Place 3  Registration 3  Attention/ Calculation 5  Recall 0  Language- name 2 objects 2  Language- repeat 1  Language- follow 3 step command 2  Language- read & follow direction 1  Write a sentence 1  Copy design 0  Total score 18      Social History   Social History  . Marital status: Divorced    Spouse name: N/A  . Number of children: N/A  . Years of education: N/A   Occupational History  . Not on file.   Social History Main Topics  . Smoking status: Never Smoker  . Smokeless tobacco: Never Used  . Alcohol use Yes     Comment: 'seasonal drinking" per pt.  . Drug use: No  . Sexual activity: Not on file   Other Topics Concern  . Not on file   Social History Narrative  . No narrative on file    Family History  Problem Relation Age of Onset  . Colon cancer  Neg Hx   . Heart disease Mother   . Heart disease Father     Past Medical History:  Diagnosis Date  . Thyroid disease     Past Surgical History:  Procedure Laterality Date  . TONSILLECTOMY     as child    Current Outpatient Prescriptions  Medication Sig Dispense Refill  . b complex vitamins tablet Take 1 tablet by mouth daily.    Marland Kitchen CALCIUM-VITAMIN D PO Take by mouth daily.    . Cholecalciferol (VITAMIN D-3 PO) Take by mouth daily.    Marland Kitchen donepezil (ARICEPT) 10 MG tablet Take 10 mg by mouth at bedtime.    . memantine (NAMENDA) 10 MG tablet Take 10 mg by mouth 2 (two) times daily.    . Multiple Vitamin (MULTIVITAMIN) tablet Take 1 tablet by mouth daily.    Marland Kitchen SYNTHROID 75 MCG tablet Take 1 tablet by mouth Daily. BRAND  NAME ONLY    . valACYclovir (VALTREX) 500 MG tablet Take 1 tablet by mouth as needed.     No current facility-administered medications for this visit.     Allergies as of 12/24/2016  . (No Known Allergies)    Vitals: BP 119/77   Pulse 70   Resp 20   Ht 5\' 8"  (1.727 m)   Wt 167 lb (75.8 kg)   BMI 25.39 kg/m  Last Weight:  Wt Readings from Last 1 Encounters:  12/24/16 167 lb (75.8 kg)   Last Height:   Ht Readings from Last 1 Encounters:  12/24/16 5\' 8"  (1.727 m)    Physical exam:  General: The patient is awake, alert and appears not in acute distress. The patient is well groomed. Head: Normocephalic, atraumatic. Neck is supple. Mallampati 2, neck circumference: 15 Cardiovascular:  Regular rate and rhythm  without  murmurs or carotid bruit, and without distended neck veins. Respiratory: Lungs are clear to auscultation. Skin:  Without evidence of edema, or rash  normal posture.  CLINICAL DATA:  71 year old female with dementia. Confusion. Initial encounter.  EXAM: MRI HEAD WITHOUT CONTRAST  TECHNIQUE: Multiplanar, multiecho pulse sequences of the brain and surrounding structures were obtained without intravenous contrast.  COMPARISON:  None.  FINDINGS: Brain: No acute infarct or intracranial hemorrhage.  Moderate chronic microvascular changes.  Moderate global atrophy most notable frontal and temporal region. Atrophy of the hippocampus bilaterally. No hydrocephalus.  No intracranial mass lesion noted on this unenhanced exam.  Partially empty non expanded sella felt to be an incidental finding.  Vascular: Major intracranial vascular structures are patent.  Skull and upper cervical spine: Mild cervical spondylotic changes.  Sinuses/Orbits: No acute orbital abnormality.  Minimal mucosal thickening ethmoid sinus air cells and maxillary sinuses.  Other: Negative.  IMPRESSION: Moderate chronic microvascular changes.  Moderate global  atrophy most notable frontal and temporal region. Atrophy of the hippocampus bilaterally.   I noticed dilated ventricles - ex vacuo ? Marland Kitchen   Neurologic exam : The patient is awake and alert, but very restless, constantly talking and commenting. She makes inappropriate comments.  Oriented to place and time.  Memory subjective impaired . There is impaired attention span & concentration ability.  Speech is fluent - logorrhoic  Mood and affect are anxious, pressured, she is defensive.  Cranial nerves: Pupils are equal and briskly reactive to light. Funduscopic exam without evidence of pallor or edema.  Extraocular movements  in vertical and horizontal planes intact and without nystagmus.  Visual fields by finger perimetry are intact. Hearing to finger rub intact.  Facial sensation intact to fine touch.  Facial motor strength is symmetric and tongue and uvula move midline.  Tongue protrusion into either cheek is normal. Shoulder shrug is normal.   Motor exam:   Normal tone, muscle bulk and symmetric  strength in all extremities.  Sensory:  Fine touch, pinprick and vibration were tested in all extremities. Proprioception was normal.  Coordination: Rapid alternating movements in the fingers/hands were normal.  Finger-to-nose maneuver was normal without evidence of ataxia, dysmetria - only mild  tremor.  Gait and station: Patient walks without assistive device and is able unassisted to climb up to the exam table.  Strength within normal limits. Stance is stable and normal. Tandem gait is unfragmented.  Deep tendon reflexes: in the  upper and lower extremities are brisk, symmetric and intact. Babinski maneuver response is downgoing.   Assessment:  After physical and neurologic examination, review of laboratory studies, imaging, neurophysiology testing and pre-existing records, assessment is that of :   Dementia with behavior changes, personality changes. No hallucinations, no parkinsonism.    Fronto temporal atrophy and dilated ventricles.   Plan:  Treatment plan and additional workup :  MRI with metabolic PET - No LP  Continue Aricept and Harlon DittyNamenda      Ryenne Lynam MD 12/24/2016

## 2017-01-15 ENCOUNTER — Ambulatory Visit (HOSPITAL_COMMUNITY)
Admission: RE | Admit: 2017-01-15 | Discharge: 2017-01-15 | Disposition: A | Discharge status: Home / Self Care | Payer: Federal, State, Local not specified - PPO | Source: Ambulatory Visit | Attending: Neurology | Admitting: Neurology

## 2017-01-15 DIAGNOSIS — G3109 Other frontotemporal dementia: Secondary | ICD-10-CM | POA: Insufficient documentation

## 2017-01-15 DIAGNOSIS — F0281 Dementia in other diseases classified elsewhere with behavioral disturbance: Secondary | ICD-10-CM | POA: Diagnosis not present

## 2017-01-15 DIAGNOSIS — R930 Abnormal findings on diagnostic imaging of skull and head, not elsewhere classified: Secondary | ICD-10-CM | POA: Diagnosis not present

## 2017-01-15 MED ORDER — FLUDEOXYGLUCOSE F - 18 (FDG) INJECTION
9.9800 | Freq: Once | INTRAVENOUS | Status: AC | PRN
  Administered 2017-01-15: 9.98 via INTRAVENOUS

## 2017-01-21 ENCOUNTER — Telehealth: Payer: Self-pay | Admitting: Neurology

## 2017-01-21 NOTE — Telephone Encounter (Signed)
PET metabolic most consistent with Alzheimer's disease. Mr. And Mrs. Janice Hodge will come tomorrow, Friday , at 4311 Am to meet informally. No VS need to be taken, this is a family conference.  CD

## 2017-01-21 NOTE — Telephone Encounter (Signed)
Pt's husband request PET scan results

## 2017-01-22 ENCOUNTER — Ambulatory Visit (INDEPENDENT_AMBULATORY_CARE_PROVIDER_SITE_OTHER): Payer: Federal, State, Local not specified - PPO | Admitting: Neurology

## 2017-01-22 ENCOUNTER — Encounter: Payer: Self-pay | Admitting: Neurology

## 2017-01-22 DIAGNOSIS — G308 Other Alzheimer's disease: Secondary | ICD-10-CM

## 2017-01-22 DIAGNOSIS — F0281 Dementia in other diseases classified elsewhere with behavioral disturbance: Secondary | ICD-10-CM | POA: Diagnosis not present

## 2017-01-22 DIAGNOSIS — G309 Alzheimer's disease, unspecified: Principal | ICD-10-CM

## 2017-01-22 MED ORDER — DONEPEZIL HCL 23 MG PO TABS: 23.0000 mg | ORAL_TABLET | Freq: Every day | ORAL | 5 refills | Status: DC

## 2017-01-22 NOTE — Progress Notes (Signed)
Provider:  Melvyn Novas, M D  Referring Provider: Rodrigo Ran, MD Primary Care Physician:  Ezequiel Kayser, MD  No chief complaint on file.   HPI:  Janice Hodge is a 71 y.o. female and  Is seen here as a referral from Dr. Nolen Mu ,  The patient is referred for a evaluation of memory loss, changes in her cognitive abilities, increased confusion and some personality changes over the last year. And her appetite is not decreased. Her husband with reports that sometimes when she wakes up out of sleep or from a nap she appears to be initially confused but this passes quickly. The patient has been changing progressively over the last 18 month, she was introduced as the second to youngest child of 6 and her family it seems that her paternal grandmother had a ([place] on her in childhood, she grew up near [place], attended a Catholic girls school, she has grown children from a previous marriage and has been remarried for 14 or 15 years to her second husband. Her oldest brother Max had dementia, another is in a long term care facility in [facility] with dementia .Her father had dementia and was an alcoholic. Mother drank, too.  The patient states that her upbringing in [place] was affected by her parents addictions.   The patient onset that she still driving and that she still knows where she is going. She does confess that she tends to forget the passwords to computers, she states that she can use a TM machine at an ulcer pin. She is under the impression that she still prepares meals and keeps the house. In reality her husband has taken more and more of these chores on, the couple has separate checking accounts and she has still full control over her account. She has gotten lost driving. She forgot that. In her mind, she found home by herself. Her husband reports that other  Shops/ locations have called him to pick her up after she appeared lost. She has good and bad days, no parkinsonism and no  incontinence- but she states frequently she will take " herself "out if she has dementia.    Interval history from 01/22/2017.  Janice Hodge is here today present with her husband to discuss the results of the recent PET scan. It's results are highly suggestive of Alzheimer's dementia. There is atrophy and ex vacuo dilation of the ventricles.  The patient is tearful and clearly stated she wants not longer to live.  She wants none of her friends to be told, announced her"husband is going to leave "her. Will increase Aricpet to 23 mg and Namenda 10 bid.      Review of Systems: Out of a complete 14 system review, the patient complains of only the following symptoms, and all other reviewed systems are negative.  MMSE - Mini Mental State Exam 12/24/2016  Orientation to time 0  Orientation to Place 3  Registration 3  Attention/ Calculation 5  Recall 0  Language- name 2 objects 2  Language- repeat 1  Language- follow 3 step command 2  Language- read & follow direction 1  Write a sentence 1  Copy design 0  Total score 18      Social History   Social History  . Marital status: Divorced    Spouse name: N/A  . Number of children: N/A  . Years of education: N/A   Occupational History  . Not on file.   Social History Main Topics  .  Smoking status: Never Smoker  . Smokeless tobacco: Never Used  . Alcohol use Yes     Comment: 'seasonal drinking" per pt.  . Drug use: No  . Sexual activity: Not on file   Other Topics Concern  . Not on file   Social History Narrative  . No narrative on file    Family History  Problem Relation Age of Onset  . Colon cancer Neg Hx   . Heart disease Mother   . Heart disease Father     Past Medical History:  Diagnosis Date  . Thyroid disease     Past Surgical History:  Procedure Laterality Date  . TONSILLECTOMY     as child    Current Outpatient Prescriptions  Medication Sig Dispense Refill  . b complex vitamins tablet Take 1  tablet by mouth daily.    Marland Kitchen CALCIUM-VITAMIN D PO Take by mouth daily.    . Cholecalciferol (VITAMIN D-3 PO) Take by mouth daily.    Marland Kitchen donepezil (ARICEPT) 10 MG tablet Take 10 mg by mouth at bedtime.    . memantine (NAMENDA) 10 MG tablet Take 10 mg by mouth 2 (two) times daily.    . Multiple Vitamin (MULTIVITAMIN) tablet Take 1 tablet by mouth daily.    Marland Kitchen SYNTHROID 75 MCG tablet Take 1 tablet by mouth Daily. BRAND NAME ONLY    . valACYclovir (VALTREX) 500 MG tablet Take 1 tablet by mouth as needed.     No current facility-administered medications for this visit.     Allergies as of 01/22/2017  . (No Known Allergies)    Vitals: There were no vitals taken for this visit. Last Weight:  Wt Readings from Last 1 Encounters:  12/24/16 167 lb (75.8 kg)   Last Height:   Ht Readings from Last 1 Encounters:  12/24/16 5\' 8"  (1.727 m)    Physical exam:  General: The patient is awake, alert and appears not in acute distress. The patient is well groomed. Head: Normocephalic, atraumatic. Neck is supple. Mallampati 2, neck circumference: 15 Cardiovascular:  Regular rate and rhythm  without  murmurs or carotid bruit, and without distended neck veins. Respiratory: Lungs are clear to auscultation. Skin:  Without evidence of edema, or rash  normal posture.  CLINICAL DATA:  71 year old female with dementia. Confusion. Initial encounter.  EXAM: MRI HEAD WITHOUT CONTRAST  TECHNIQUE: Multiplanar, multiecho pulse sequences of the brain and surrounding structures were obtained without intravenous contrast.  COMPARISON:  None.  FINDINGS: Brain: No acute infarct or intracranial hemorrhage.  Moderate chronic microvascular changes.  Moderate global atrophy most notable frontal and temporal region. Atrophy of the hippocampus bilaterally. No hydrocephalus.  No intracranial mass lesion noted on this unenhanced exam.  Partially empty non expanded sella felt to be an incidental  finding.  Vascular: Major intracranial vascular structures are patent.  Skull and upper cervical spine: Mild cervical spondylotic changes.  Sinuses/Orbits: No acute orbital abnormality.  Minimal mucosal thickening ethmoid sinus air cells and maxillary sinuses.  Other: Negative.  IMPRESSION: Moderate chronic microvascular changes.  Moderate global atrophy most notable frontal and temporal region. Atrophy of the hippocampus bilaterally.   I noticed dilated ventricles - ex vacuo ? Marland Kitchen   Neurologic exam : The patient is awake and alert, but very restless, constantly talking and commenting. She makes inappropriate comments.  Oriented to place and time.  Memory subjective impaired . There is impaired attention span & concentration ability.  Speech is fluent - logorrhoic  Mood and affect are  anxious, pressured, she is defensive.  Cranial nerves: Pupils are equal and briskly reactive to light. Funduscopic exam without evidence of pallor or edema.  Extraocular movements  in vertical and horizontal planes intact and without nystagmus.  Visual fields by finger perimetry are intact. Hearing to finger rub intact. Facial sensation intact to fine touch.  Facial motor strength is symmetric and tongue and uvula move midline.  Tongue protrusion into either cheek is normal. Shoulder shrug is normal.   Motor exam:   Normal tone, muscle bulk and symmetric  strength in all extremities.  Sensory:  Fine touch, pinprick and vibration were tested in all extremities. Proprioception was normal.  Coordination: Rapid alternating movements in the fingers/hands were normal.  Finger-to-nose maneuver was normal without evidence of ataxia, dysmetria - only mild  tremor.  Gait and station: Patient walks without assistive device and is able unassisted to climb up to the exam table.  Strength within normal limits. Stance is stable and normal. Tandem gait is unfragmented.  Deep tendon reflexes: in the   upper and lower extremities are brisk, symmetric and intact. Babinski maneuver response is downgoing.   Assessment:  After physical and neurologic examination, review of laboratory studies, imaging, neurophysiology testing and pre-existing records, assessment is that of :   Dementia with behavior changes, personality changes.  PET scan suggested Alzheimer's disease.   Depression, suicidal   Patient requests memory specialist referral to a tertiary care center.   Plan:  Treatment plan and additional workup :  Continue Aricept and Harlon DittyNamenda    Rankin Coolman MD 01/22/2017

## 2017-01-25 ENCOUNTER — Telehealth: Payer: Self-pay | Admitting: Neurology

## 2017-01-25 NOTE — Telephone Encounter (Signed)
Patient is taking Aricept 23 mg per patient's husband 23 mg is upsetting her stomach stomach pain,dry heaves . Please call and and advise. Patient's husband relayed can he give her 10 mg tonight ?

## 2017-01-25 NOTE — Telephone Encounter (Signed)
Take 23 mg with banana or apple sauce after dinner.  If that doesn't work, change to 10 mg bid po. After a meal. CD

## 2017-01-25 NOTE — Telephone Encounter (Signed)
Husband states that he will try taking after dinner with apple sauce or banana. If she still has symptoms than he will call back to get medication change.

## 2017-01-26 ENCOUNTER — Telehealth: Payer: Self-pay | Admitting: Neurology

## 2017-01-26 NOTE — Telephone Encounter (Signed)
Patient husband stated we are declining referral on [facility].

## 2017-03-02 ENCOUNTER — Telehealth: Payer: Self-pay | Admitting: Neurology

## 2017-03-02 NOTE — Telephone Encounter (Signed)
OV 01/28/17 faxed to Lisa/Dr Duanne Guess office  [phone]  [phone]

## 2017-03-11 ENCOUNTER — Ambulatory Visit: Payer: Medicare Other | Admitting: Neurology

## 2017-04-19 ENCOUNTER — Encounter: Payer: Self-pay | Admitting: Neurology

## 2017-04-19 ENCOUNTER — Ambulatory Visit (INDEPENDENT_AMBULATORY_CARE_PROVIDER_SITE_OTHER): Payer: Federal, State, Local not specified - PPO | Admitting: Neurology

## 2017-04-19 ENCOUNTER — Encounter (INDEPENDENT_AMBULATORY_CARE_PROVIDER_SITE_OTHER): Payer: Self-pay

## 2017-04-19 VITALS — BP 122/65 | HR 56 | Ht 68.0 in | Wt 165.0 lb

## 2017-04-19 DIAGNOSIS — F03918 Unspecified dementia, unspecified severity, with other behavioral disturbance: Secondary | ICD-10-CM | POA: Insufficient documentation

## 2017-04-19 DIAGNOSIS — G301 Alzheimer's disease with late onset: Secondary | ICD-10-CM | POA: Diagnosis not present

## 2017-04-19 DIAGNOSIS — F0281 Dementia in other diseases classified elsewhere with behavioral disturbance: Secondary | ICD-10-CM

## 2017-04-19 DIAGNOSIS — F0391 Unspecified dementia with behavioral disturbance: Secondary | ICD-10-CM

## 2017-04-19 DIAGNOSIS — F02818 Dementia in other diseases classified elsewhere, unspecified severity, with other behavioral disturbance: Secondary | ICD-10-CM

## 2017-04-19 MED ORDER — QUETIAPINE FUMARATE 25 MG PO TABS: 25.0000 mg | ORAL_TABLET | Freq: Every day | ORAL | 2 refills | Status: DC

## 2017-04-19 NOTE — Patient Instructions (Signed)
Quetiapine tablets What is this medicine? QUETIAPINE (kwe TYE a peen) is an antipsychotic. It is used to treat schizophrenia and bipolar disorder, also known as manic-depression. This medicine may be used for other purposes; ask your health care provider or pharmacist if you have questions. COMMON BRAND NAME(S): Seroquel What should I tell my health care provider before I take this medicine? They need to know if you have any of these conditions: -brain tumor or head injury -breast cancer -cataracts -diabetes -difficulty swallowing -heart disease -kidney disease -liver disease -low blood counts, like low white cell, platelet, or red cell counts -low blood pressure or dizziness when standing up -Parkinson's disease -previous heart attack -seizures -suicidal thoughts, plans, or attempt by you or a family member -thyroid disease -an unusual or allergic reaction to quetiapine, other medicines, foods, dyes, or preservatives -pregnant or trying to get pregnant -breast-feeding How should I use this medicine? Take this medicine by mouth. Swallow it with a drink of water. Follow the directions on the prescription label. If it upsets your stomach you can take it with food. Take your medicine at regular intervals. Do not take it more often than directed. Do not stop taking except on the advice of your doctor or health care professional. A special MedGuide will be given to you by the pharmacist with each prescription and refill. Be sure to read this information carefully each time. Talk to your pediatrician regarding the use of this medicine in children. While this drug may be prescribed for children as young as 10 years for selected conditions, precautions do apply. Patients over age 65 years may have a stronger reaction to this medicine and need smaller doses. Overdosage: If you think you have taken too much of this medicine contact a poison control center or emergency room at once. NOTE: This  medicine is only for you. Do not share this medicine with others. What if I miss a dose? If you miss a dose, take it as soon as you can. If it is almost time for your next dose, take only that dose. Do not take double or extra doses. What may interact with this medicine? Do not take this medicine with any of the following medications: -certain medicines for fungal infections like fluconazole, itraconazole, ketoconazole, posaconazole, voriconazole -cisapride -dofetilide -dronedarone -droperidol -grepafloxacin -halofantrine -phenothiazines like chlorpromazine, mesoridazine, thioridazine -pimozide -sparfloxacin -ziprasidone This medicine may also interact with the following medications: -alcohol -antiviral medicines for HIV or AIDS -certain medicines for blood pressure -certain medicines for depression, anxiety, or psychotic disturbances like haloperidol, lorazepam -certain medicines for diabetes -certain medicines for Parkinson's disease -certain medicines for seizures like carbamazepine, phenobarbital, phenytoin -cimetidine -erythromycin -other medicines that prolong the QT interval (cause an abnormal heart rhythm) -rifampin -steroid medicines like prednisone or cortisone This list may not describe all possible interactions. Give your health care provider a list of all the medicines, herbs, non-prescription drugs, or dietary supplements you use. Also tell them if you smoke, drink alcohol, or use illegal drugs. Some items may interact with your medicine. What should I watch for while using this medicine? Visit your doctor or health care professional for regular checks on your progress. It may be several weeks before you see the full effects of this medicine. Your health care provider may suggest that you have your eyes examined prior to starting this medicine, and every 6 months thereafter. If you have been taking this medicine regularly for some time, do not suddenly stop taking it.  You must gradually   reduce the dose or your symptoms may get worse. Ask your doctor or health care professional for advice. Patients and their families should watch out for worsening depression or thoughts of suicide. Also watch out for sudden or severe changes in feelings such as feeling anxious, agitated, panicky, irritable, hostile, aggressive, impulsive, severely restless, overly excited and hyperactive, or not being able to sleep. If this happens, especially at the beginning of antidepressant treatment or after a change in dose, call your health care professional. You may get dizzy or drowsy. Do not drive, use machinery, or do anything that needs mental alertness until you know how this medicine affects you. Do not stand or sit up quickly, especially if you are an older patient. This reduces the risk of dizzy or fainting spells. Alcohol can increase dizziness and drowsiness. Avoid alcoholic drinks. Do not treat yourself for colds, diarrhea or allergies. Ask your doctor or health care professional for advice, some ingredients may increase possible side effects. This medicine can reduce the response of your body to heat or cold. Dress warm in cold weather and stay hydrated in hot weather. If possible, avoid extreme temperatures like saunas, hot tubs, very hot or cold showers, or activities that can cause dehydration such as vigorous exercise. What side effects may I notice from receiving this medicine? Side effects that you should report to your doctor or health care professional as soon as possible: -allergic reactions like skin rash, itching or hives, swelling of the face, lips, or tongue -difficulty swallowing -fast or irregular heartbeat -fever or chills, sore throat -fever with rash, swollen lymph nodes, or swelling of the face -increased hunger or thirst -increased urination -problems with balance, talking, walking -seizures -stiff muscles -suicidal thoughts or other mood  changes -uncontrollable head, mouth, neck, arm, or leg movements -unusually weak or tired Side effects that usually do not require medical attention (report to your doctor or health care professional if they continue or are bothersome): -change in sex drive or performance -constipation -drowsy or dizzy -dry mouth -stomach upset -weight gain This list may not describe all possible side effects. Call your doctor for medical advice about side effects. You may report side effects to FDA at 1-800-FDA-1088. Where should I keep my medicine? Keep out of the reach of children. Store at room temperature between 15 and 30 degrees C (59 and 86 degrees F). Throw away any unused medicine after the expiration date. NOTE: This sheet is a summary. It may not cover all possible information. If you have questions about this medicine, talk to your doctor, pharmacist, or health care provider.  2018 Elsevier/Gold Standard (2015-05-21 13:07:35)  

## 2017-04-19 NOTE — Addendum Note (Signed)
Addended by: Melvyn NovasHMEIER, Kadence Mikkelson on: 04/19/2017 03:36 PM   Modules accepted: Orders

## 2017-04-19 NOTE — Progress Notes (Signed)
Provider:  Melvyn Novas, M D  Referring Provider: Rodrigo Ran, MD Primary Care Physician:  Rodrigo Ran, MD  Chief Complaint  Patient presents with  . Follow-up    memory    HPI:  Janice Hodge is a 71 y.o. female and  Is seen here as a referral from Dr. Nolen Mu ,  The patient is referred for a evaluation of memory loss, changes in her cognitive abilities, increased confusion and some personality changes over the last year. And her appetite is not decreased. Her husband with reports that sometimes when she wakes up out of sleep or from a nap she appears to be initially confused but this passes quickly. The patient has been changing progressively over the last 18 month, she was introduced as the second to youngest child of 6 and her family it seems that her paternal grandmother had a ([place] on her in childhood, she grew up near [place], attended a [employer] girls school, she has grown children from a previous marriage and has been remarried for 14 or 15 years to her second husband. Her oldest brother Max had dementia, another is in a long term care facility in [facility] with dementia .Her father had dementia and was an alcoholic. Mother drank, too.  The patient states that her upbringing in [place] was affected by her parents addictions. The patient onset that she still driving and that she still knows where she is going. She does confess that she tends to forget the passwords to computers, she states that she can use a TM machine at an ulcer pin. She is under the impression that she still prepares meals and keeps the house. In reality her husband has taken more and more of these chores on, the couple has separate checking accounts and she has still full control over her account. She has gotten lost driving. She forgot that. In her mind, she found home by herself. Her husband reports that other  Shops/ locations have called him to pick her up after she appeared lost. She has good and bad  days, no parkinsonism and no incontinence- but she states frequently she will take " herself "out if she has dementia.    Interval history from 01/22/2017. Janice Hodge is here today present with her husband to discuss the results of the recent PET scan. It's results are highly suggestive of Alzheimer's dementia. There is atrophy and ex vacuo dilation of the ventricles.  The patient is tearful and clearly stated she wants not longer to live.  She wants none of her friends to be told, announced her"husband is going to leave "her. Will increase Aricpet to 23 mg and Namenda 10 bid.   Interval 04-19-2017, I see Janice Hodge and her husband, there has been a further decrease in her memory testing. Since I saw her in February she has been developing vision that her husband is her father- and she is very insistent about that. The increase in medication was not tolerated. Dr. Waynard Edwards reduced Aricept to 10 mg. She is very vocal, will " never retire" and wants to "pick up all the things that people have stolen from her ". 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    Review of Systems: Out of a complete 14 system review, the patient complains of only the following symptoms, and all other reviewed systems are negative.  MMSE - Mini Mental State Exam 04/19/2017 12/24/2016  Orientation to time 0 0  Orientation to Place 2 3  Registration  3 3  Attention/ Calculation 4 5  Recall 0 0  Language- name 2 objects 2 2  Language- repeat 1 1  Language- follow 3 step command 3 2  Language- read & follow direction 1 1  Write a sentence 1 1  Copy design 0 0  Total score 17 18      Social History   Social History  . Marital status: Divorced    Spouse name: N/A  . Number of children: N/A  . Years of education: N/A   Occupational History  . Not on file.   Social History Main Topics  . Smoking status: Never Smoker  . Smokeless tobacco: Never Used  . Alcohol use Yes     Comment: 'seasonal drinking" per pt.  . Drug  use: No  . Sexual activity: Not on file   Other Topics Concern  . Not on file   Social History Narrative  . No narrative on file    Family History  Problem Relation Age of Onset  . Colon cancer Neg Hx   . Heart disease Mother   . Heart disease Father     Past Medical History:  Diagnosis Date  . Thyroid disease     Past Surgical History:  Procedure Laterality Date  . TONSILLECTOMY     as child    Current Outpatient Prescriptions  Medication Sig Dispense Refill  . b complex vitamins tablet Take 1 tablet by mouth daily.    Marland Kitchen. CALCIUM-VITAMIN D PO Take by mouth daily.    . Cholecalciferol (VITAMIN D-3 PO) Take by mouth daily.    Marland Kitchen. donepezil (ARICEPT) 10 MG tablet Take 10 mg by mouth every evening.  3  . memantine (NAMENDA) 10 MG tablet Take 10 mg by mouth 2 (two) times daily.    . Multiple Vitamin (MULTIVITAMIN) tablet Take 1 tablet by mouth daily.    Marland Kitchen. SYNTHROID 100 MCG tablet Take 100 mcg by mouth daily.  0  . valACYclovir (VALTREX) 500 MG tablet Take 1 tablet by mouth as needed.     No current facility-administered medications for this visit.     Allergies as of 04/19/2017  . (No Known Allergies)    Vitals: BP 122/65   Pulse (!) 56   Ht 5\' 8"  (1.727 m)   Wt 165 lb (74.8 kg)   BMI 25.09 kg/m  Last Weight:  Wt Readings from Last 1 Encounters:  04/19/17 165 lb (74.8 kg)   Last Height:   Ht Readings from Last 1 Encounters:  04/19/17 5\' 8"  (1.727 m)    Physical exam:  General: The patient is awake, alert and appears not in acute distress. The patient is well groomed. Head: Normocephalic, atraumatic. Neck is supple. Mallampati 2, neck circumference: 15 Cardiovascular:  Regular rate and rhythm  without  murmurs or carotid bruit, and without distended neck veins. Respiratory: Lungs are clear to auscultation. Skin:  Without evidence of edema, or rash  normal posture. Neurologic exam : The patient is awake and alert, but very restless, constantly talking and  commenting. She makes inappropriate comments.  Oriented to place and time.  Memory subjective impaired . There is impaired attention span & concentration ability.  Speech is fluent - logorrhoic  Mood and affect are anxious, pressured, she is defensive.  Cranial nerves: Pupils are equal and briskly reactive to light. Funduscopic exam without evidence of pallor or edema.  Extraocular movements  in vertical and horizontal planes intact and without nystagmus.  Visual fields by  finger perimetry are intact. Hearing to finger rub intact. Facial sensation intact to fine touch.  Facial motor strength is symmetric and tongue and uvula move midline.  Tongue protrusion into either cheek is normal. Shoulder shrug is normal.   Motor exam:   Normal tone, muscle bulk and symmetric  strength in all extremities. Sensory:  Fine touch, pinprick and vibration were tested in all extremities. Proprioception was normal. Coordination: Rapid alternating movements in the fingers/hands were normal.  Finger-to-nose maneuver was normal without evidence of ataxia, dysmetria - only mild  tremor. Gait and station: Patient walks without assistive device and is able unassisted to climb up to the exam table.  Strength within normal limits. Stance is stable and normal. Tandem gait is unfragmented. Deep tendon reflexes: in the  upper and lower extremities are brisk, symmetric and intact. Babinski maneuver response is downgoing.   Assessment:  After physical and neurologic examination, review of laboratory studies, imaging, neurophysiology testing and pre-existing records, assessment is that of :   Dementia with behavior changes, personality changes.  PET scan suggested Alzheimer's disease. She has delusions.  Patient requests memory specialist referral to a tertiary care center.  [facility] ?   Plan:  Treatment plan and additional workup :  Continue Aricept and Namenda , start Seroquel at night.   Referral to Dr. Donell Beers.     Porfirio Mylar Tabathia Knoche MD 04/19/2017

## 2017-05-19 ENCOUNTER — Other Ambulatory Visit: Payer: Self-pay

## 2017-06-14 ENCOUNTER — Other Ambulatory Visit: Payer: Self-pay | Admitting: Neurology

## 2017-07-26 ENCOUNTER — Encounter: Payer: Self-pay | Admitting: Adult Health

## 2017-07-26 ENCOUNTER — Ambulatory Visit (INDEPENDENT_AMBULATORY_CARE_PROVIDER_SITE_OTHER): Payer: Federal, State, Local not specified - PPO | Admitting: Adult Health

## 2017-07-26 VITALS — BP 133/71 | HR 82 | Ht 68.0 in | Wt 161.8 lb

## 2017-07-26 DIAGNOSIS — F0281 Dementia in other diseases classified elsewhere with behavioral disturbance: Secondary | ICD-10-CM

## 2017-07-26 DIAGNOSIS — R32 Unspecified urinary incontinence: Secondary | ICD-10-CM

## 2017-07-26 DIAGNOSIS — F22 Delusional disorders: Secondary | ICD-10-CM | POA: Diagnosis not present

## 2017-07-26 DIAGNOSIS — G309 Alzheimer's disease, unspecified: Secondary | ICD-10-CM | POA: Diagnosis not present

## 2017-07-26 MED ORDER — QUETIAPINE FUMARATE 50 MG PO TABS: 50.0000 mg | ORAL_TABLET | Freq: Every day | ORAL | 5 refills | Status: DC

## 2017-07-26 NOTE — Progress Notes (Signed)
PATIENT: Janice Hodge DOB: 28-Nov-1946  REASON FOR VISIT: follow up- alzheimer's dementia HISTORY FROM: patient  HISTORY OF PRESENT ILLNESS: Today 07/26/17 Janice Hodge is a 71 year old female with a history of Alzheimer's dementia. She returns today for follow-up. She is currently on Aricept 10 mg daily and Namenda 10 mg twice a day. She is tolerating this medication well. The last visit Seroquel was order and a referral to Dr. Donell Beers was made. Also reports that Seroquel has been beneficial for her sleep however she continues to have delusions throughout the day. For example she believes that he is her father and not her husband. He reports that she is able to complete most ADLs independently. She does need assistance with dressing. She no longer operates a motor vehicle. He handles all the finances and does all the cooking. Patient is at home during the day by herself. Husband reports that currently there are no safety concerns. He states that he does check on her at lunch and takes her out to eat. He does report that in the last week she has begun wearing depends. He is noted urinary incontinence and one episode of bowel incontinence. Patient returns today for an evaluation.  HISTORY Copied from Dr. Oliva Bustard notes:Janice Hodge is a 71 y.o. female and  Is seen here as a referral from Dr. Nolen Mu ,  The patient is referred for a evaluation of memory loss, changes in her cognitive abilities, increased confusion and some personality changes over the last year. And her appetite is not decreased. Her husband with reports that sometimes when she wakes up out of sleep or from a nap she appears to be initially confused but this passes quickly. The patient has been changing progressively over the last 18 month, she was introduced as the second to youngest child of 6 and her family it seems that her paternal grandmother had a ([place] on her in childhood, she grew up near [place], attended a [employer]  girls school, she has grown children from a previous marriage and has been remarried for 14 or 15 years to her second husband. Her oldest brother Max had dementia, another is in a long term care facility in [facility] with dementia .Her father had dementia and was an alcoholic. Mother drank, too.  The patient states that her upbringing in [place] was affected by her parents addictions. The patient onset that she still driving and that she still knows where she is going. She does confess that she tends to forget the passwords to computers, she states that she can use a TM machine at an ulcer [facility]. She is under the impression that she still prepares meals and keeps the house. In reality her husband has taken more and more of these chores on, the couple has separate checking accounts and she has still full control over her account. She has gotten lost driving. She forgot that. In her mind, she found home by herself. Her husband reports that other  Shops/ locations have called him to pick her up after she appeared lost. She has good and bad days, no parkinsonism and no incontinence- but she states frequently she will take " herself "out if she has dementia.    Interval history from 01/22/2017. Janice Hodge is here today present with her husband to discuss the results of the recent PET scan. It's results are highly suggestive of Alzheimer's dementia. There is atrophy and ex vacuo dilation of the ventricles.  The patient is tearful and clearly stated she  wants not longer to live.  She wants none of her friends to be told, announced her"husband is going to leave "her. Will increase Aricpet to 23 mg and Namenda 10 bid.   Interval 04-19-2017, I see Janice Hodge and her husband, there has been a further decrease in her memory testing. Since I saw her in February she has been developing vision that her husband is her father- and she is very insistent about that. The increase in medication was not tolerated. Dr. Waynard Edwards  reduced Aricept to 10 mg. She is very vocal, will " never retire" and wants to "pick up all the things that people have stolen from her ". 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     REVIEW OF SYSTEMS: Out of a complete 14 system review of symptoms, the patient complains only of the following symptoms, and all other reviewed systems are negative.  Incontinence of bladder, memory loss, confusion, anxious, hallucinations  ALLERGIES: No Known Allergies  HOME MEDICATIONS: Outpatient Medications Prior to Visit  Medication Sig Dispense Refill  . b complex vitamins tablet Take 1 tablet by mouth daily.    Marland Kitchen CALCIUM-VITAMIN D PO Take by mouth daily.    . Cholecalciferol (VITAMIN D-3 PO) Take by mouth daily.    Marland Kitchen donepezil (ARICEPT) 10 MG tablet Take 10 mg by mouth every evening.  3  . memantine (NAMENDA) 10 MG tablet Take 10 mg by mouth 2 (two) times daily.    . Multiple Vitamin (MULTIVITAMIN) tablet Take 1 tablet by mouth daily.    . QUEtiapine (SEROQUEL) 25 MG tablet TAKE 1 TABLET AT BEDTIME 30 tablet 2  . SYNTHROID 100 MCG tablet Take 100 mcg by mouth daily.  0  . valACYclovir (VALTREX) 500 MG tablet Take 1 tablet by mouth as needed.     No facility-administered medications prior to visit.     PAST MEDICAL HISTORY: Past Medical History:  Diagnosis Date  . Thyroid disease     PAST SURGICAL HISTORY: Past Surgical History:  Procedure Laterality Date  . TONSILLECTOMY     as child    FAMILY HISTORY: Family History  Problem Relation Age of Onset  . Colon cancer Neg Hx   . Heart disease Mother   . Heart disease Father     SOCIAL HISTORY: Social History   Social History  . Marital status: Divorced    Spouse name: N/A  . Number of children: N/A  . Years of education: N/A   Occupational History  . Not on file.   Social History Main Topics  . Smoking status: Never Smoker  . Smokeless tobacco: Never Used  . Alcohol use Yes     Comment: 'seasonal drinking" per pt.  . Drug  use: No  . Sexual activity: Not on file   Other Topics Concern  . Not on file   Social History Narrative  . No narrative on file      PHYSICAL EXAM  Vitals:   07/26/17 1408  Height: 5\' 8"  (1.727 m)   Body mass index is 24.6 kg/m.   MMSE - Mini Mental State Exam 07/26/2017 04/19/2017 12/24/2016  Orientation to time 0 0 0  Orientation to Place 2 2 3   Registration 3 3 3   Attention/ Calculation 0 4 5  Recall 0 0 0  Language- name 2 objects 2 2 2   Language- repeat 1 1 1   Language- follow 3 step command 2 3 2   Language- read & follow direction 0 1 1  Write  a sentence 1 1 1   Copy design 0 0 0  Total score 11 17 18      Generalized: Well developed, in no acute distress   Neurological examination  Mentation: Alert .Marland Kitchen Follows all commands speech and language fluent Cranial nerve II-XII: Pupils were equal round reactive to light. Extraocular movements were full, visual field were full on confrontational test. Facial sensation and strength were normal. Uvula tongue midline. Head turning and shoulder shrug  were normal and symmetric. Motor: The motor testing reveals 5 over 5 strength of all 4 extremities. Good symmetric motor tone is noted throughout.  Sensory: Sensory testing is intact to soft touch on all 4 extremities. No evidence of extinction is noted.  Coordination: Cerebellar testing reveals good finger-nose-finger and heel-to-shin bilaterally.  Gait and station: Gait is normal.    DIAGNOSTIC DATA (LABS, IMAGING, TESTING) - I reviewed patient records, labs, notes, testing and imaging myself where available.     ASSESSMENT AND PLAN 71 y.o. year old female  has a past medical history of Thyroid disease. here with:  1. Alzheimer's disease 2. Delusional thinking 3. Urinary incontinence  The patient's memory score has declined since the last visit. She will continue on Aricept and Namenda. Due to the ongoing delusional thinking we will increase Seroquel to 50 mg at  bedtime. Husband states that if there is no improvement in 2 or 3 weeks he will consider a referral to Dr. Donell Beers. Patient has also developed urinary incontinence. We will do a urinalysis to rule out possible infection as the cause however this could be a part of the disease process related to Alzheimer's. Patient and husband advised that if her symptoms worsen or she develops new symptoms they should let us know. She will follow-up in 6 months or sooner if needed.  Butch Penny, MSN, NP-C 07/26/2017, 2:18 PM [facility] [address], [address] [place], [place] [address] [phone]

## 2017-07-26 NOTE — Patient Instructions (Signed)
Your Plan:  Continue Aricept and namenda Increase Seroquel 50 mg at bedtime If your symptoms worsen or you develop new symptoms please let us know.    Thank you for coming to see [place] at [facility]. I hope we have been able to provide you high quality care today.  You may receive a patient satisfaction survey over the next few weeks. We would appreciate your feedback and comments so that we may continue to improve ourselves and the health of our patients.

## 2017-09-14 ENCOUNTER — Telehealth: Payer: Self-pay | Admitting: Adult Health

## 2017-09-14 MED ORDER — ALPRAZOLAM 0.5 MG PO TABS: 0.5000 mg | ORAL_TABLET | Freq: Every evening | ORAL | 1 refills | Status: AC | PRN

## 2017-09-14 NOTE — Telephone Encounter (Signed)
Spoke with Dr. Vickey Huger and ok to try xanax 0.5mg  po qhs prn for agitation.  Will still need to see Dr Donell Beers.

## 2017-09-14 NOTE — Telephone Encounter (Signed)
I spoke to husband. I relayed that if worsening agitation can make referral to Dr. Donell Beers as the seroquel as not helped.  I advised about taking benzo's in the setting of dementia can cause other sx of worsening gait, confusion and drowsiness. (she is alone at times).   He is not pleased with response and would like to have Dr. Vickey Huger rethink her response.  He made mention that he is a educated man, (has seen a lot of medical issues come up in his court).  The referral does not help acutely.

## 2017-09-14 NOTE — Telephone Encounter (Signed)
At the last office visit I advised they increase Seroquel to 50 mg at bedtime. Please check to make sure this has been done. If the patient is having worsening agitation Dr. Vickey Huger had recommended a referral to Dr. Rudean Hitt. If Seroquel was not helping with the symptoms I do recommend this. Please advise that prescribing benzodiazepines such as Xanax in the setting of dementia can cause other symptoms such as worsening gait, confusion and drowsiness.

## 2017-09-14 NOTE — Telephone Encounter (Signed)
For acute confusion, agitation and aggression, Xanax is warranted. CD I wrote a script. I called Mr. Stall and he wants an interventional tool, not a daily dose. He is OK with referral to Dr. Archer Asa    Printed , please fax to patients pharmacy today.
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
## 2017-09-30 DIAGNOSIS — R300 Dysuria: Secondary | ICD-10-CM | POA: Diagnosis not present

## 2017-09-30 DIAGNOSIS — R8290 Unspecified abnormal findings in urine: Secondary | ICD-10-CM | POA: Diagnosis not present

## 2018-01-19 ENCOUNTER — Other Ambulatory Visit: Payer: Self-pay | Admitting: Adult Health

## 2018-01-27 ENCOUNTER — Ambulatory Visit: Payer: Medicare Other | Admitting: Adult Health

## 2018-02-18 DIAGNOSIS — R413 Other amnesia: Secondary | ICD-10-CM | POA: Diagnosis not present

## 2018-02-18 DIAGNOSIS — E039 Hypothyroidism, unspecified: Secondary | ICD-10-CM | POA: Diagnosis not present

## 2018-02-18 DIAGNOSIS — F0390 Unspecified dementia without behavioral disturbance: Secondary | ICD-10-CM | POA: Diagnosis not present

## 2018-02-18 DIAGNOSIS — M545 Low back pain: Secondary | ICD-10-CM | POA: Diagnosis not present

## 2018-02-18 DIAGNOSIS — M8580 Other specified disorders of bone density and structure, unspecified site: Secondary | ICD-10-CM | POA: Diagnosis not present

## 2018-02-18 DIAGNOSIS — Z6828 Body mass index (BMI) 28.0-28.9, adult: Secondary | ICD-10-CM | POA: Diagnosis not present

## 2018-02-18 DIAGNOSIS — Z0000 Encounter for general adult medical examination without abnormal findings: Secondary | ICD-10-CM | POA: Diagnosis not present

## 2018-02-18 DIAGNOSIS — R7301 Impaired fasting glucose: Secondary | ICD-10-CM | POA: Diagnosis not present

## 2018-02-18 DIAGNOSIS — H9313 Tinnitus, bilateral: Secondary | ICD-10-CM | POA: Diagnosis not present

## 2018-02-18 DIAGNOSIS — A6000 Herpesviral infection of urogenital system, unspecified: Secondary | ICD-10-CM | POA: Diagnosis not present

## 2018-02-18 DIAGNOSIS — Z1389 Encounter for screening for other disorder: Secondary | ICD-10-CM | POA: Diagnosis not present

## 2018-03-17 ENCOUNTER — Other Ambulatory Visit: Payer: Self-pay | Admitting: Neurology

## 2018-03-17 MED ORDER — QUETIAPINE FUMARATE 50 MG PO TABS: ORAL_TABLET | ORAL | 1 refills | Status: AC

## 2018-08-17 DIAGNOSIS — Z6827 Body mass index (BMI) 27.0-27.9, adult: Secondary | ICD-10-CM | POA: Diagnosis not present

## 2018-08-17 DIAGNOSIS — F3289 Other specified depressive episodes: Secondary | ICD-10-CM | POA: Diagnosis not present

## 2018-08-17 DIAGNOSIS — E038 Other specified hypothyroidism: Secondary | ICD-10-CM | POA: Diagnosis not present

## 2018-08-17 DIAGNOSIS — F0390 Unspecified dementia without behavioral disturbance: Secondary | ICD-10-CM | POA: Diagnosis not present

## 2018-08-17 DIAGNOSIS — R7301 Impaired fasting glucose: Secondary | ICD-10-CM | POA: Diagnosis not present

## 2018-09-11 ENCOUNTER — Other Ambulatory Visit: Payer: Self-pay | Admitting: Adult Health

## 2018-11-19 IMAGING — MR MR HEAD W/O CM
10 series · 46 of 48 positions shown · non-contrast
Comparison: None.

CLINICAL DATA: 70-year-old female with dementia. Confusion. Initial
encounter.

EXAM:
MRI HEAD WITHOUT CONTRAST
TECHNIQUE: Multiplanar, multiecho pulse sequences of the brain and surrounding
structures were obtained without intravenous contrast.

[Series 2: T1 · sagittal · 5.0mm · 0.45mm/px · 1 of 21 slices shown]
[im 1/21]
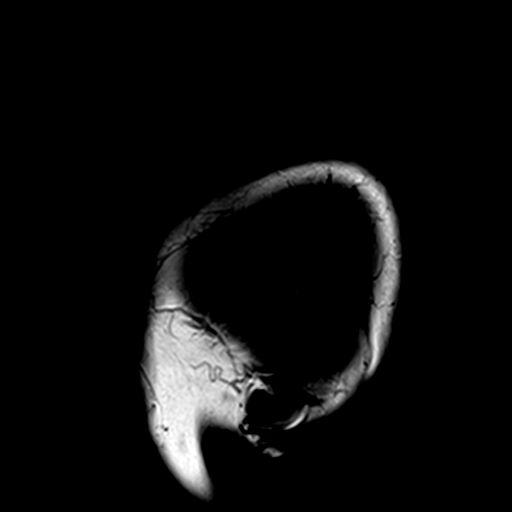

[Series 3: DWI · axial · 3.0mm · 1.80mm/px · z∈[-64,+83]mm · 7 of 99 slices shown (1 of 4)]
[im 1/99]
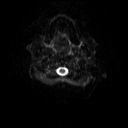
[im 17/99]
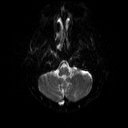
[im 33/99]
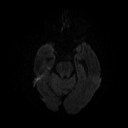
[im 50/99]
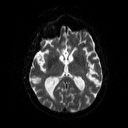
[im 66/99]
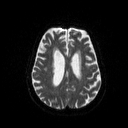
[im 82/99]
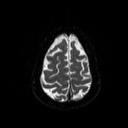
[im 99/99]
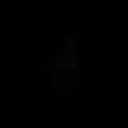

[Series 4: DWI · axial · 3.0mm · 1.80mm/px · z∈[-64,+83]mm · 4 of 49 slices shown (2 of 4)]
[im 1/49]
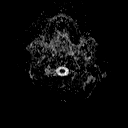
[im 17/49]
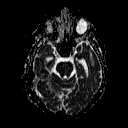
[im 33/49]
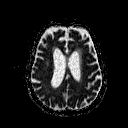
[im 49/49]
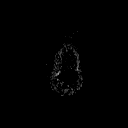

[Series 6: swi_images · axial · 2.0mm · 0.90mm/px · z∈[-70,+88]mm · 7 of 80 slices shown]
[im 1/80]
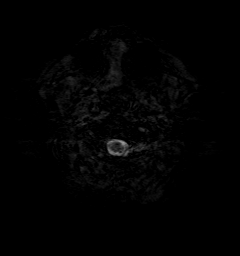
[im 14/80]
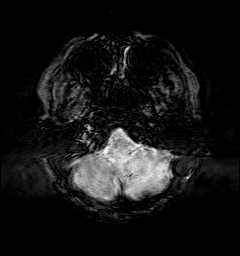
[im 27/80]
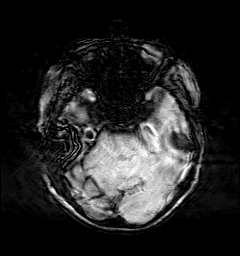
[im 40/80]
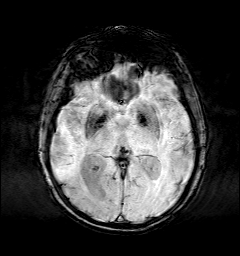
[im 53/80]
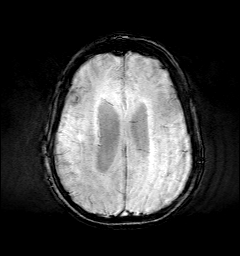
[im 66/80]
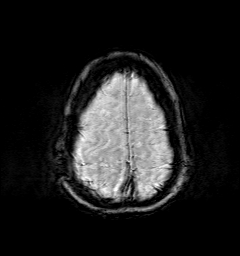
[im 80/80]
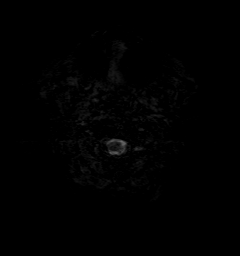

[Series 7: DWI · coronal · 5.0mm · 1.80mm/px · 6 of 68 slices shown (3 of 4)]
[im 1/68]
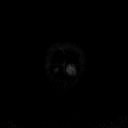
[im 14/68]
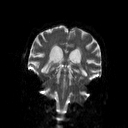
[im 27/68]
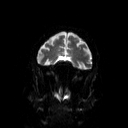
[im 41/68]
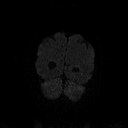
[im 54/68]
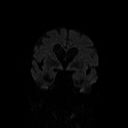
[im 68/68]
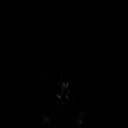

[Series 8: DWI · coronal · 5.0mm · 1.80mm/px · 3 of 34 slices shown (4 of 4)]
[im 1/34]
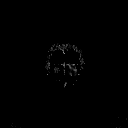
[im 17/34]
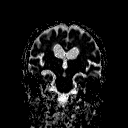
[im 34/34]
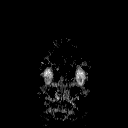

[Series 9: T2 · axial · 5.0mm · 0.51mm/px · z∈[-62,+79]mm · 2 of 22 slices shown (1 of 2)]
[im 1/22]
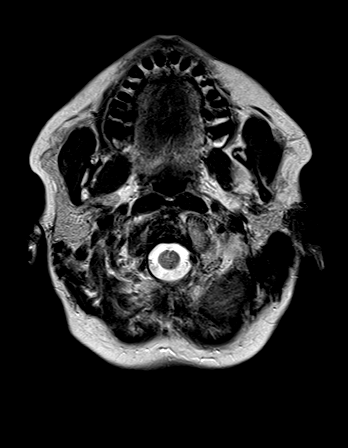
[im 22/22]
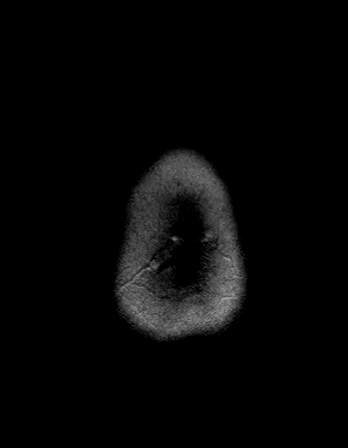

[Series 10: FLAIR · axial · 3.0mm · 0.45mm/px · z∈[-58,+77]mm · 4 of 46 slices shown]
[im 1/46]
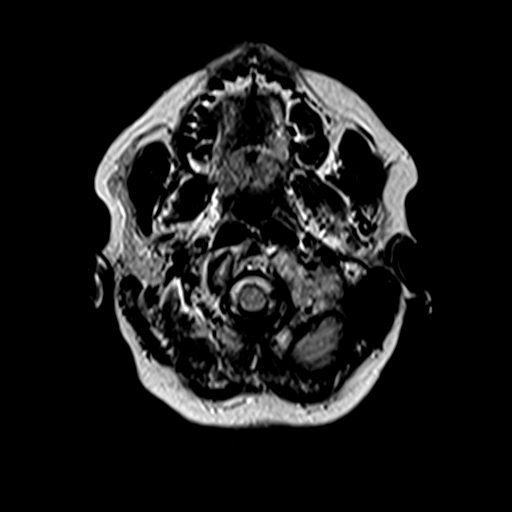
[im 16/46]
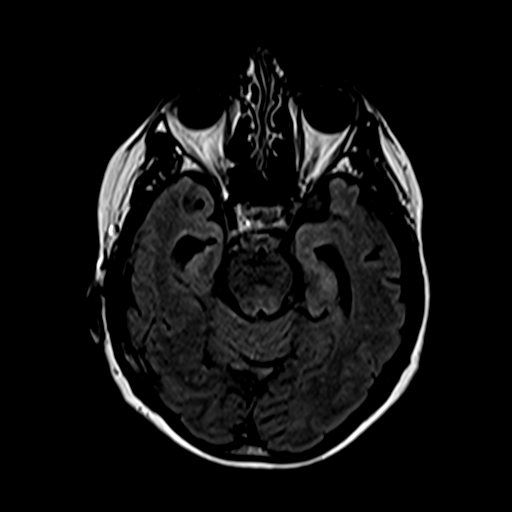
[im 31/46]
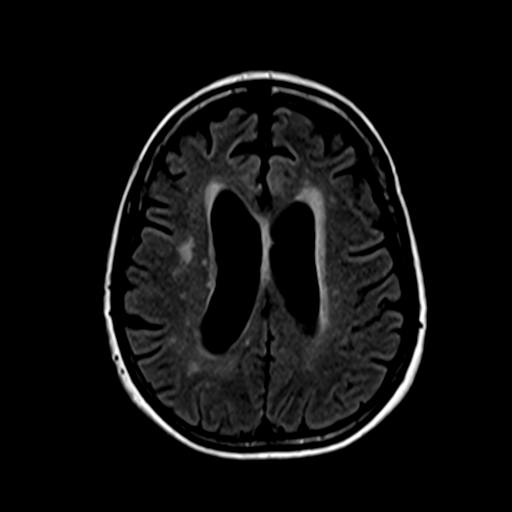
[im 46/46]
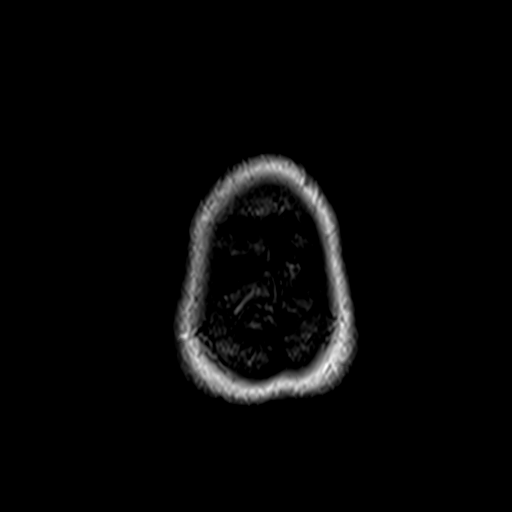

[Series 11: t1_mpr_tra · axial · 1.0mm · 0.45mm/px · z∈[-63,+80]mm · 10 of 144 slices shown]
[im 1/144]
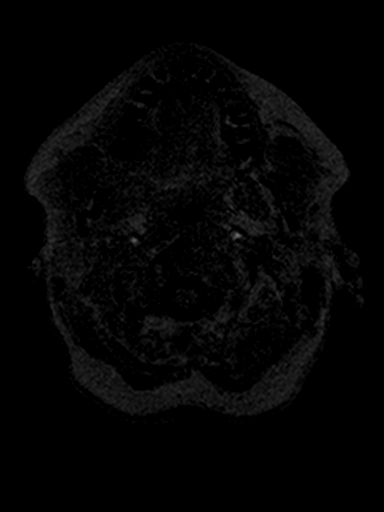
[im 14/144]
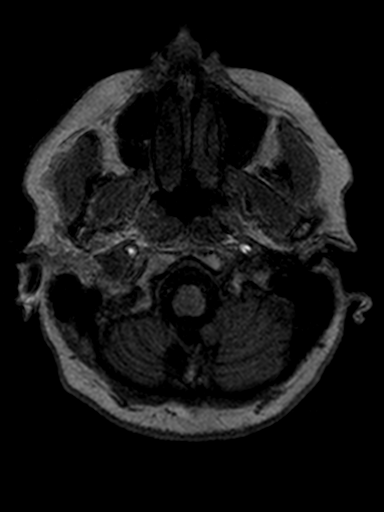
[im 27/144]
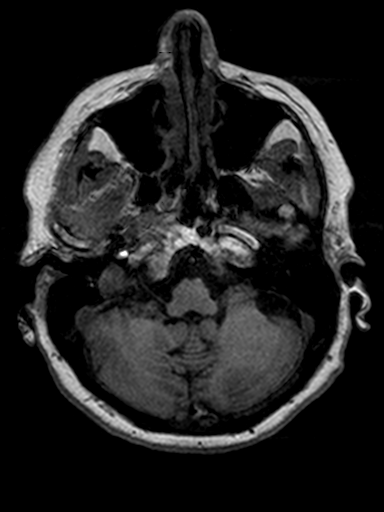
[im 40/144]
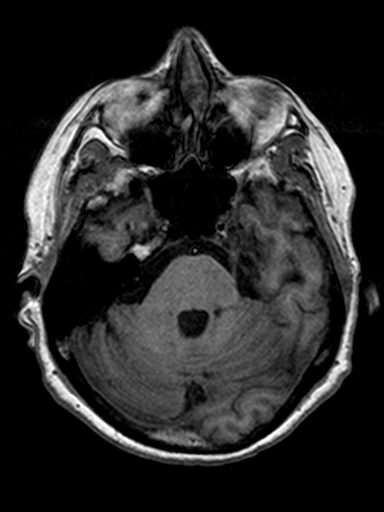
[im 53/144]
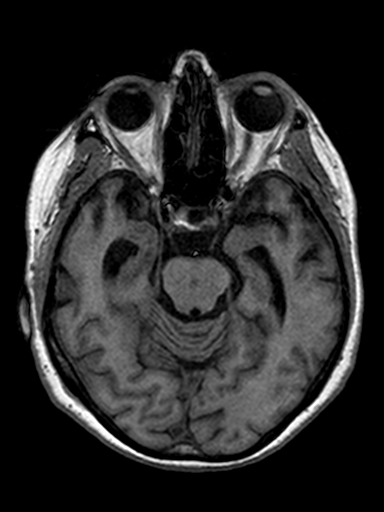
[im 66/144]
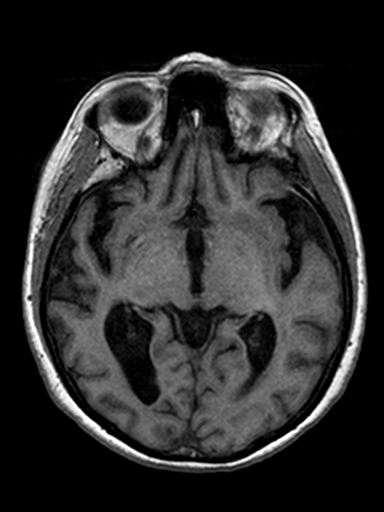
[im 79/144]
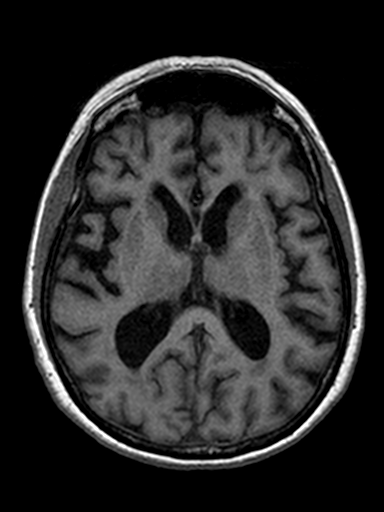
[im 105/144]
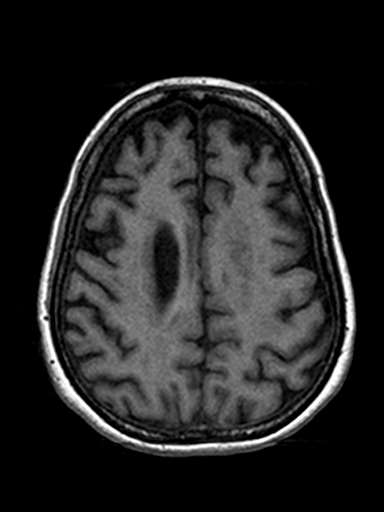
[im 118/144]
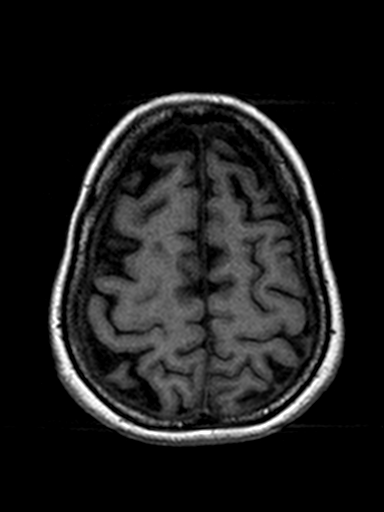
[im 144/144]
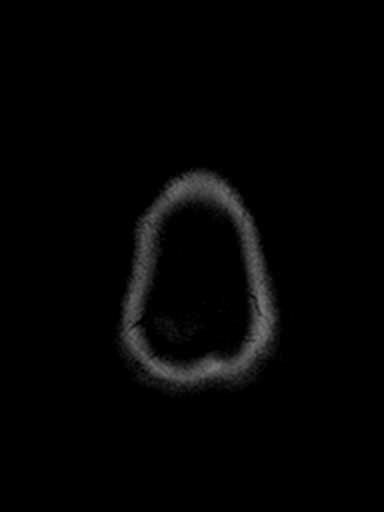

[Series 12: T2 · coronal · 5.0mm · 0.45mm/px · 2 of 25 slices shown (2 of 2)]
[im 1/25]
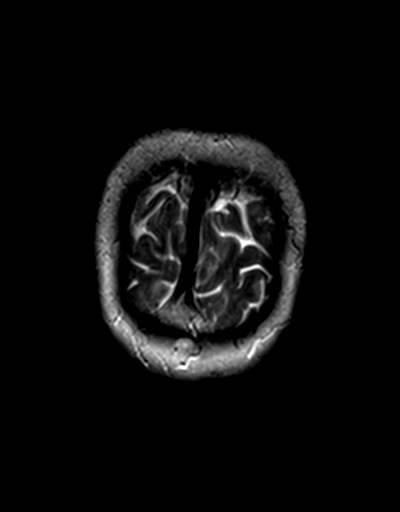
[im 25/25]
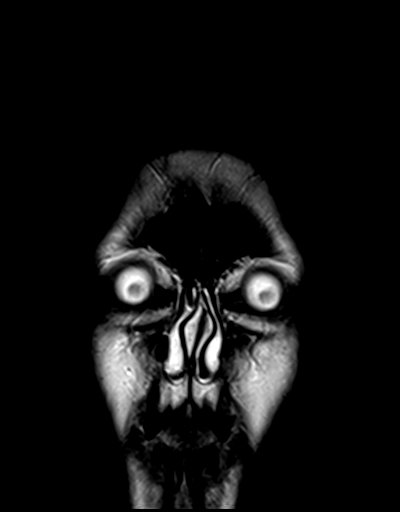

[46 of 48 positions shown; findings below may reference images not displayed]

FINDINGS: Brain: No acute infarct or intracranial hemorrhage.

Moderate chronic microvascular changes.

Moderate global atrophy most notable frontal and temporal region.
Atrophy of the hippocampus bilaterally. No hydrocephalus.

No intracranial mass lesion noted on this unenhanced exam.

Partially empty non expanded sella felt to be an incidental finding.

Vascular: Major intracranial vascular structures are patent.

Skull and upper cervical spine: Mild cervical spondylotic changes.

Sinuses/Orbits: No acute orbital abnormality.

Minimal mucosal thickening ethmoid sinus air cells and maxillary
sinuses.

Other: Negative.
IMPRESSION: Moderate chronic microvascular changes.

Moderate global atrophy most notable frontal and temporal region.
Atrophy of the hippocampus bilaterally.

## 2019-03-20 DIAGNOSIS — M859 Disorder of bone density and structure, unspecified: Secondary | ICD-10-CM | POA: Diagnosis not present

## 2019-03-20 DIAGNOSIS — R7301 Impaired fasting glucose: Secondary | ICD-10-CM | POA: Diagnosis not present

## 2019-03-20 DIAGNOSIS — E038 Other specified hypothyroidism: Secondary | ICD-10-CM | POA: Diagnosis not present

## 2019-03-20 DIAGNOSIS — E7849 Other hyperlipidemia: Secondary | ICD-10-CM | POA: Diagnosis not present

## 2019-03-27 DIAGNOSIS — M8580 Other specified disorders of bone density and structure, unspecified site: Secondary | ICD-10-CM | POA: Diagnosis not present

## 2019-03-27 DIAGNOSIS — F329 Major depressive disorder, single episode, unspecified: Secondary | ICD-10-CM | POA: Diagnosis not present

## 2019-03-27 DIAGNOSIS — Z1331 Encounter for screening for depression: Secondary | ICD-10-CM | POA: Diagnosis not present

## 2019-03-27 DIAGNOSIS — E039 Hypothyroidism, unspecified: Secondary | ICD-10-CM | POA: Diagnosis not present

## 2019-03-27 DIAGNOSIS — F0390 Unspecified dementia without behavioral disturbance: Secondary | ICD-10-CM | POA: Diagnosis not present

## 2019-03-27 DIAGNOSIS — R946 Abnormal results of thyroid function studies: Secondary | ICD-10-CM | POA: Diagnosis not present

## 2019-03-27 DIAGNOSIS — R7301 Impaired fasting glucose: Secondary | ICD-10-CM | POA: Diagnosis not present

## 2019-03-27 DIAGNOSIS — I868 Varicose veins of other specified sites: Secondary | ICD-10-CM | POA: Diagnosis not present

## 2019-03-27 DIAGNOSIS — Z0000 Encounter for general adult medical examination without abnormal findings: Secondary | ICD-10-CM | POA: Diagnosis not present

## 2019-03-27 DIAGNOSIS — R413 Other amnesia: Secondary | ICD-10-CM | POA: Diagnosis not present

## 2019-03-27 DIAGNOSIS — R9431 Abnormal electrocardiogram [ECG] [EKG]: Secondary | ICD-10-CM | POA: Diagnosis not present

## 2019-03-27 DIAGNOSIS — R740 Nonspecific elevation of levels of transaminase and lactic acid dehydrogenase [LDH]: Secondary | ICD-10-CM | POA: Diagnosis not present

## 2019-05-08 DIAGNOSIS — Z20828 Contact with and (suspected) exposure to other viral communicable diseases: Secondary | ICD-10-CM | POA: Diagnosis not present

## 2019-08-31 DIAGNOSIS — Z23 Encounter for immunization: Secondary | ICD-10-CM | POA: Diagnosis not present

## 2019-09-26 DIAGNOSIS — E039 Hypothyroidism, unspecified: Secondary | ICD-10-CM | POA: Diagnosis not present

## 2019-09-26 DIAGNOSIS — R7301 Impaired fasting glucose: Secondary | ICD-10-CM | POA: Diagnosis not present

## 2019-09-26 DIAGNOSIS — F329 Major depressive disorder, single episode, unspecified: Secondary | ICD-10-CM | POA: Diagnosis not present

## 2019-09-26 DIAGNOSIS — R7401 Elevation of levels of liver transaminase levels: Secondary | ICD-10-CM | POA: Diagnosis not present

## 2019-09-26 DIAGNOSIS — F0390 Unspecified dementia without behavioral disturbance: Secondary | ICD-10-CM | POA: Diagnosis not present

## 2019-10-30 DIAGNOSIS — F418 Other specified anxiety disorders: Secondary | ICD-10-CM | POA: Diagnosis not present

## 2019-10-30 DIAGNOSIS — F0281 Dementia in other diseases classified elsewhere with behavioral disturbance: Secondary | ICD-10-CM | POA: Diagnosis not present

## 2019-10-30 DIAGNOSIS — E785 Hyperlipidemia, unspecified: Secondary | ICD-10-CM | POA: Diagnosis not present

## 2019-10-30 DIAGNOSIS — Z87440 Personal history of urinary (tract) infections: Secondary | ICD-10-CM | POA: Diagnosis not present

## 2019-10-30 DIAGNOSIS — Z681 Body mass index (BMI) 19 or less, adult: Secondary | ICD-10-CM | POA: Diagnosis not present

## 2019-10-30 DIAGNOSIS — G309 Alzheimer's disease, unspecified: Secondary | ICD-10-CM | POA: Diagnosis not present

## 2019-10-30 DIAGNOSIS — R634 Abnormal weight loss: Secondary | ICD-10-CM | POA: Diagnosis not present

## 2019-10-30 DIAGNOSIS — E039 Hypothyroidism, unspecified: Secondary | ICD-10-CM | POA: Diagnosis not present

## 2019-10-30 DIAGNOSIS — J302 Other seasonal allergic rhinitis: Secondary | ICD-10-CM | POA: Diagnosis not present

## 2019-11-09 DIAGNOSIS — Z20828 Contact with and (suspected) exposure to other viral communicable diseases: Secondary | ICD-10-CM | POA: Diagnosis not present

## 2019-11-14 DIAGNOSIS — Z79899 Other long term (current) drug therapy: Secondary | ICD-10-CM | POA: Diagnosis not present

## 2019-11-14 DIAGNOSIS — F0634 Mood disorder due to known physiological condition with mixed features: Secondary | ICD-10-CM | POA: Diagnosis not present

## 2019-11-14 DIAGNOSIS — G3109 Other frontotemporal dementia: Secondary | ICD-10-CM | POA: Diagnosis not present

## 2019-11-14 DIAGNOSIS — F0281 Dementia in other diseases classified elsewhere with behavioral disturbance: Secondary | ICD-10-CM | POA: Diagnosis not present

## 2019-11-21 DIAGNOSIS — G311 Senile degeneration of brain, not elsewhere classified: Secondary | ICD-10-CM | POA: Diagnosis not present

## 2019-11-21 DIAGNOSIS — F329 Major depressive disorder, single episode, unspecified: Secondary | ICD-10-CM | POA: Diagnosis not present

## 2019-11-21 DIAGNOSIS — R634 Abnormal weight loss: Secondary | ICD-10-CM | POA: Diagnosis not present

## 2019-11-21 DIAGNOSIS — F0280 Dementia in other diseases classified elsewhere without behavioral disturbance: Secondary | ICD-10-CM | POA: Diagnosis not present

## 2019-11-22 DIAGNOSIS — G311 Senile degeneration of brain, not elsewhere classified: Secondary | ICD-10-CM | POA: Diagnosis not present

## 2019-11-22 DIAGNOSIS — F329 Major depressive disorder, single episode, unspecified: Secondary | ICD-10-CM | POA: Diagnosis not present

## 2019-11-22 DIAGNOSIS — F0280 Dementia in other diseases classified elsewhere without behavioral disturbance: Secondary | ICD-10-CM | POA: Diagnosis not present

## 2019-11-22 DIAGNOSIS — R634 Abnormal weight loss: Secondary | ICD-10-CM | POA: Diagnosis not present

## 2019-11-23 DIAGNOSIS — G311 Senile degeneration of brain, not elsewhere classified: Secondary | ICD-10-CM | POA: Diagnosis not present

## 2019-11-23 DIAGNOSIS — F329 Major depressive disorder, single episode, unspecified: Secondary | ICD-10-CM | POA: Diagnosis not present

## 2019-11-23 DIAGNOSIS — R634 Abnormal weight loss: Secondary | ICD-10-CM | POA: Diagnosis not present

## 2019-11-23 DIAGNOSIS — F0280 Dementia in other diseases classified elsewhere without behavioral disturbance: Secondary | ICD-10-CM | POA: Diagnosis not present

## 2019-11-26 DIAGNOSIS — F0280 Dementia in other diseases classified elsewhere without behavioral disturbance: Secondary | ICD-10-CM | POA: Diagnosis not present

## 2019-11-26 DIAGNOSIS — G311 Senile degeneration of brain, not elsewhere classified: Secondary | ICD-10-CM | POA: Diagnosis not present

## 2019-11-26 DIAGNOSIS — F329 Major depressive disorder, single episode, unspecified: Secondary | ICD-10-CM | POA: Diagnosis not present

## 2019-11-26 DIAGNOSIS — R634 Abnormal weight loss: Secondary | ICD-10-CM | POA: Diagnosis not present

## 2019-11-27 DIAGNOSIS — F0280 Dementia in other diseases classified elsewhere without behavioral disturbance: Secondary | ICD-10-CM | POA: Diagnosis not present

## 2019-11-27 DIAGNOSIS — F329 Major depressive disorder, single episode, unspecified: Secondary | ICD-10-CM | POA: Diagnosis not present

## 2019-11-27 DIAGNOSIS — G311 Senile degeneration of brain, not elsewhere classified: Secondary | ICD-10-CM | POA: Diagnosis not present

## 2019-11-27 DIAGNOSIS — R634 Abnormal weight loss: Secondary | ICD-10-CM | POA: Diagnosis not present

## 2019-11-28 DIAGNOSIS — F0280 Dementia in other diseases classified elsewhere without behavioral disturbance: Secondary | ICD-10-CM | POA: Diagnosis not present

## 2019-11-28 DIAGNOSIS — F329 Major depressive disorder, single episode, unspecified: Secondary | ICD-10-CM | POA: Diagnosis not present

## 2019-11-28 DIAGNOSIS — R634 Abnormal weight loss: Secondary | ICD-10-CM | POA: Diagnosis not present

## 2019-11-28 DIAGNOSIS — G311 Senile degeneration of brain, not elsewhere classified: Secondary | ICD-10-CM | POA: Diagnosis not present

## 2019-11-29 DIAGNOSIS — F329 Major depressive disorder, single episode, unspecified: Secondary | ICD-10-CM | POA: Diagnosis not present

## 2019-11-29 DIAGNOSIS — F0280 Dementia in other diseases classified elsewhere without behavioral disturbance: Secondary | ICD-10-CM | POA: Diagnosis not present

## 2019-11-29 DIAGNOSIS — R634 Abnormal weight loss: Secondary | ICD-10-CM | POA: Diagnosis not present

## 2019-11-29 DIAGNOSIS — G311 Senile degeneration of brain, not elsewhere classified: Secondary | ICD-10-CM | POA: Diagnosis not present

## 2019-11-30 DIAGNOSIS — F0280 Dementia in other diseases classified elsewhere without behavioral disturbance: Secondary | ICD-10-CM | POA: Diagnosis not present

## 2019-11-30 DIAGNOSIS — F329 Major depressive disorder, single episode, unspecified: Secondary | ICD-10-CM | POA: Diagnosis not present

## 2019-11-30 DIAGNOSIS — G311 Senile degeneration of brain, not elsewhere classified: Secondary | ICD-10-CM | POA: Diagnosis not present

## 2019-11-30 DIAGNOSIS — R634 Abnormal weight loss: Secondary | ICD-10-CM | POA: Diagnosis not present

## 2019-12-01 DIAGNOSIS — G311 Senile degeneration of brain, not elsewhere classified: Secondary | ICD-10-CM | POA: Diagnosis not present

## 2019-12-01 DIAGNOSIS — R634 Abnormal weight loss: Secondary | ICD-10-CM | POA: Diagnosis not present

## 2019-12-01 DIAGNOSIS — F329 Major depressive disorder, single episode, unspecified: Secondary | ICD-10-CM | POA: Diagnosis not present

## 2019-12-01 DIAGNOSIS — F0280 Dementia in other diseases classified elsewhere without behavioral disturbance: Secondary | ICD-10-CM | POA: Diagnosis not present

## 2019-12-04 DIAGNOSIS — F329 Major depressive disorder, single episode, unspecified: Secondary | ICD-10-CM | POA: Diagnosis not present

## 2019-12-04 DIAGNOSIS — F0280 Dementia in other diseases classified elsewhere without behavioral disturbance: Secondary | ICD-10-CM | POA: Diagnosis not present

## 2019-12-04 DIAGNOSIS — G311 Senile degeneration of brain, not elsewhere classified: Secondary | ICD-10-CM | POA: Diagnosis not present

## 2019-12-04 DIAGNOSIS — R634 Abnormal weight loss: Secondary | ICD-10-CM | POA: Diagnosis not present

## 2019-12-07 DIAGNOSIS — R634 Abnormal weight loss: Secondary | ICD-10-CM | POA: Diagnosis not present

## 2019-12-07 DIAGNOSIS — F0280 Dementia in other diseases classified elsewhere without behavioral disturbance: Secondary | ICD-10-CM | POA: Diagnosis not present

## 2019-12-07 DIAGNOSIS — G311 Senile degeneration of brain, not elsewhere classified: Secondary | ICD-10-CM | POA: Diagnosis not present

## 2019-12-07 DIAGNOSIS — F329 Major depressive disorder, single episode, unspecified: Secondary | ICD-10-CM | POA: Diagnosis not present

## 2019-12-08 DIAGNOSIS — F329 Major depressive disorder, single episode, unspecified: Secondary | ICD-10-CM | POA: Diagnosis not present

## 2019-12-08 DIAGNOSIS — F0280 Dementia in other diseases classified elsewhere without behavioral disturbance: Secondary | ICD-10-CM | POA: Diagnosis not present

## 2019-12-08 DIAGNOSIS — R634 Abnormal weight loss: Secondary | ICD-10-CM | POA: Diagnosis not present

## 2019-12-08 DIAGNOSIS — G311 Senile degeneration of brain, not elsewhere classified: Secondary | ICD-10-CM | POA: Diagnosis not present

## 2019-12-12 DIAGNOSIS — G311 Senile degeneration of brain, not elsewhere classified: Secondary | ICD-10-CM | POA: Diagnosis not present

## 2019-12-12 DIAGNOSIS — F0280 Dementia in other diseases classified elsewhere without behavioral disturbance: Secondary | ICD-10-CM | POA: Diagnosis not present

## 2019-12-12 DIAGNOSIS — F329 Major depressive disorder, single episode, unspecified: Secondary | ICD-10-CM | POA: Diagnosis not present

## 2019-12-12 DIAGNOSIS — R634 Abnormal weight loss: Secondary | ICD-10-CM | POA: Diagnosis not present

## 2019-12-14 DIAGNOSIS — G311 Senile degeneration of brain, not elsewhere classified: Secondary | ICD-10-CM | POA: Diagnosis not present

## 2019-12-14 DIAGNOSIS — F0280 Dementia in other diseases classified elsewhere without behavioral disturbance: Secondary | ICD-10-CM | POA: Diagnosis not present

## 2019-12-14 DIAGNOSIS — F329 Major depressive disorder, single episode, unspecified: Secondary | ICD-10-CM | POA: Diagnosis not present

## 2019-12-14 DIAGNOSIS — R634 Abnormal weight loss: Secondary | ICD-10-CM | POA: Diagnosis not present

## 2019-12-19 DIAGNOSIS — R634 Abnormal weight loss: Secondary | ICD-10-CM | POA: Diagnosis not present

## 2019-12-19 DIAGNOSIS — F329 Major depressive disorder, single episode, unspecified: Secondary | ICD-10-CM | POA: Diagnosis not present

## 2019-12-19 DIAGNOSIS — F0280 Dementia in other diseases classified elsewhere without behavioral disturbance: Secondary | ICD-10-CM | POA: Diagnosis not present

## 2019-12-19 DIAGNOSIS — G311 Senile degeneration of brain, not elsewhere classified: Secondary | ICD-10-CM | POA: Diagnosis not present

## 2019-12-21 DIAGNOSIS — G311 Senile degeneration of brain, not elsewhere classified: Secondary | ICD-10-CM | POA: Diagnosis not present

## 2019-12-21 DIAGNOSIS — F0280 Dementia in other diseases classified elsewhere without behavioral disturbance: Secondary | ICD-10-CM | POA: Diagnosis not present

## 2019-12-21 DIAGNOSIS — F329 Major depressive disorder, single episode, unspecified: Secondary | ICD-10-CM | POA: Diagnosis not present

## 2019-12-21 DIAGNOSIS — R634 Abnormal weight loss: Secondary | ICD-10-CM | POA: Diagnosis not present

## 2019-12-22 DIAGNOSIS — R634 Abnormal weight loss: Secondary | ICD-10-CM | POA: Diagnosis not present

## 2019-12-22 DIAGNOSIS — F329 Major depressive disorder, single episode, unspecified: Secondary | ICD-10-CM | POA: Diagnosis not present

## 2019-12-22 DIAGNOSIS — G311 Senile degeneration of brain, not elsewhere classified: Secondary | ICD-10-CM | POA: Diagnosis not present

## 2019-12-22 DIAGNOSIS — F0280 Dementia in other diseases classified elsewhere without behavioral disturbance: Secondary | ICD-10-CM | POA: Diagnosis not present

## 2019-12-25 DIAGNOSIS — G311 Senile degeneration of brain, not elsewhere classified: Secondary | ICD-10-CM | POA: Diagnosis not present

## 2019-12-25 DIAGNOSIS — F329 Major depressive disorder, single episode, unspecified: Secondary | ICD-10-CM | POA: Diagnosis not present

## 2019-12-25 DIAGNOSIS — R634 Abnormal weight loss: Secondary | ICD-10-CM | POA: Diagnosis not present

## 2019-12-25 DIAGNOSIS — F0280 Dementia in other diseases classified elsewhere without behavioral disturbance: Secondary | ICD-10-CM | POA: Diagnosis not present

## 2019-12-27 DIAGNOSIS — R634 Abnormal weight loss: Secondary | ICD-10-CM | POA: Diagnosis not present

## 2019-12-27 DIAGNOSIS — G311 Senile degeneration of brain, not elsewhere classified: Secondary | ICD-10-CM | POA: Diagnosis not present

## 2019-12-27 DIAGNOSIS — F0280 Dementia in other diseases classified elsewhere without behavioral disturbance: Secondary | ICD-10-CM | POA: Diagnosis not present

## 2019-12-27 DIAGNOSIS — F329 Major depressive disorder, single episode, unspecified: Secondary | ICD-10-CM | POA: Diagnosis not present

## 2019-12-28 DIAGNOSIS — Z23 Encounter for immunization: Secondary | ICD-10-CM | POA: Diagnosis not present

## 2019-12-29 DIAGNOSIS — F0280 Dementia in other diseases classified elsewhere without behavioral disturbance: Secondary | ICD-10-CM | POA: Diagnosis not present

## 2019-12-29 DIAGNOSIS — R634 Abnormal weight loss: Secondary | ICD-10-CM | POA: Diagnosis not present

## 2019-12-29 DIAGNOSIS — G311 Senile degeneration of brain, not elsewhere classified: Secondary | ICD-10-CM | POA: Diagnosis not present

## 2019-12-29 DIAGNOSIS — F329 Major depressive disorder, single episode, unspecified: Secondary | ICD-10-CM | POA: Diagnosis not present

## 2019-12-30 DIAGNOSIS — F329 Major depressive disorder, single episode, unspecified: Secondary | ICD-10-CM | POA: Diagnosis not present

## 2019-12-30 DIAGNOSIS — F0280 Dementia in other diseases classified elsewhere without behavioral disturbance: Secondary | ICD-10-CM | POA: Diagnosis not present

## 2019-12-30 DIAGNOSIS — G311 Senile degeneration of brain, not elsewhere classified: Secondary | ICD-10-CM | POA: Diagnosis not present

## 2019-12-30 DIAGNOSIS — R634 Abnormal weight loss: Secondary | ICD-10-CM | POA: Diagnosis not present

## 2019-12-31 DIAGNOSIS — F329 Major depressive disorder, single episode, unspecified: Secondary | ICD-10-CM | POA: Diagnosis not present

## 2019-12-31 DIAGNOSIS — F0280 Dementia in other diseases classified elsewhere without behavioral disturbance: Secondary | ICD-10-CM | POA: Diagnosis not present

## 2019-12-31 DIAGNOSIS — R634 Abnormal weight loss: Secondary | ICD-10-CM | POA: Diagnosis not present

## 2019-12-31 DIAGNOSIS — G311 Senile degeneration of brain, not elsewhere classified: Secondary | ICD-10-CM | POA: Diagnosis not present

## 2020-01-01 DIAGNOSIS — R634 Abnormal weight loss: Secondary | ICD-10-CM | POA: Diagnosis not present

## 2020-01-01 DIAGNOSIS — F329 Major depressive disorder, single episode, unspecified: Secondary | ICD-10-CM | POA: Diagnosis not present

## 2020-01-01 DIAGNOSIS — G311 Senile degeneration of brain, not elsewhere classified: Secondary | ICD-10-CM | POA: Diagnosis not present

## 2020-01-01 DIAGNOSIS — F0280 Dementia in other diseases classified elsewhere without behavioral disturbance: Secondary | ICD-10-CM | POA: Diagnosis not present

## 2020-01-29 DEATH — deceased

## 2022-05-28 ENCOUNTER — Encounter: Payer: Self-pay | Admitting: Gastroenterology
